# Patient Record
Sex: Female | Born: 1960 | Race: White | Hispanic: No | Smoking: Never smoker
Health system: Southern US, Community
[De-identification: ages and names within clinical notes are randomized; demographics above are authoritative.]

## PROBLEM LIST (undated history)

## (undated) DIAGNOSIS — R350 Frequency of micturition: Secondary | ICD-10-CM

## (undated) DIAGNOSIS — N201 Calculus of ureter: Secondary | ICD-10-CM

## (undated) DIAGNOSIS — Z87442 Personal history of urinary calculi: Secondary | ICD-10-CM

## (undated) DIAGNOSIS — R3915 Urgency of urination: Secondary | ICD-10-CM

---
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
## 2011-09-22 ENCOUNTER — Other Ambulatory Visit: Payer: Self-pay | Admitting: Neurosurgery

## 2011-09-22 DIAGNOSIS — M542 Cervicalgia: Secondary | ICD-10-CM

## 2011-09-25 ENCOUNTER — Ambulatory Visit
Admission: RE | Admit: 2011-09-25 | Discharge: 2011-09-25 | Disposition: A | Discharge status: Home / Self Care | Payer: BC Managed Care – PPO | Source: Ambulatory Visit | Attending: Neurosurgery | Admitting: Neurosurgery

## 2011-09-25 DIAGNOSIS — M542 Cervicalgia: Secondary | ICD-10-CM

## 2014-01-19 ENCOUNTER — Encounter (HOSPITAL_COMMUNITY): Payer: Self-pay | Admitting: Emergency Medicine

## 2014-01-19 ENCOUNTER — Emergency Department (HOSPITAL_COMMUNITY): Payer: BC Managed Care – PPO

## 2014-01-19 ENCOUNTER — Emergency Department (HOSPITAL_COMMUNITY)
Admission: EM | Admit: 2014-01-19 | Discharge: 2014-01-19 | Disposition: A | Discharge status: Home / Self Care | Payer: BC Managed Care – PPO | Attending: Emergency Medicine | Admitting: Emergency Medicine

## 2014-01-19 DIAGNOSIS — N23 Unspecified renal colic: Secondary | ICD-10-CM | POA: Insufficient documentation

## 2014-01-19 DIAGNOSIS — Z79899 Other long term (current) drug therapy: Secondary | ICD-10-CM | POA: Insufficient documentation

## 2014-01-19 LAB — URINALYSIS, ROUTINE W REFLEX MICROSCOPIC
Bilirubin Urine: NEGATIVE
GLUCOSE, UA: NEGATIVE mg/dL
Ketones, ur: NEGATIVE mg/dL
Nitrite: NEGATIVE
Specific Gravity, Urine: >1.03 — ABNORMAL HIGH (ref 1.005–1.030)
Urobilinogen, UA: 0.2 mg/dL (ref 0.0–1.0)
pH: 5.5 (ref 5.0–8.0)

## 2014-01-19 LAB — URINE MICROSCOPIC-ADD ON

## 2014-01-19 LAB — COMPREHENSIVE METABOLIC PANEL
ALK PHOS: 74 U/L (ref 39–117)
ALT: 18 U/L (ref 0–35)
AST: 21 U/L (ref 0–37)
Albumin: 4.1 g/dL (ref 3.5–5.2)
BUN: 23 mg/dL (ref 6–23)
CALCIUM: 9.8 mg/dL (ref 8.4–10.5)
CO2: 26 meq/L (ref 19–32)
Chloride: 102 mEq/L (ref 96–112)
Creatinine, Ser: 0.85 mg/dL (ref 0.50–1.10)
GFR, EST AFRICAN AMERICAN: 88 mL/min — AB (ref >90)
GFR, EST NON AFRICAN AMERICAN: 76 mL/min — AB (ref >90)
GLUCOSE: 107 mg/dL — AB (ref 70–99)
POTASSIUM: 4 meq/L (ref 3.7–5.3)
SODIUM: 140 meq/L (ref 137–147)
Total Bilirubin: 0.4 mg/dL (ref 0.3–1.2)
Total Protein: 7.9 g/dL (ref 6.0–8.3)

## 2014-01-19 LAB — CBC
HEMATOCRIT: 44 % (ref 36.0–46.0)
HEMOGLOBIN: 15.1 g/dL — AB (ref 12.0–15.0)
MCH: 30.3 pg (ref 26.0–34.0)
MCHC: 34.3 g/dL (ref 30.0–36.0)
MCV: 88.4 fL (ref 78.0–100.0)
Platelets: 253 10*3/uL (ref 150–400)
RBC: 4.98 MIL/uL (ref 3.87–5.11)
RDW: 12.4 % (ref 11.5–15.5)
WBC: 6.6 10*3/uL (ref 4.0–10.5)

## 2014-01-19 LAB — LIPASE, BLOOD: Lipase: 79 U/L — ABNORMAL HIGH (ref 11–59)

## 2014-01-19 MED ORDER — HYDROMORPHONE HCL PF 1 MG/ML IJ SOLN
1.0000 mg | Freq: Once | INTRAMUSCULAR | Status: AC
  Administered 2014-01-19: 1 mg via INTRAVENOUS
  Filled 2014-01-19: qty 1

## 2014-01-19 MED ORDER — ONDANSETRON HCL 4 MG/2ML IJ SOLN
4.0000 mg | Freq: Once | INTRAMUSCULAR | Status: AC
  Administered 2014-01-19: 4 mg via INTRAVENOUS
  Filled 2014-01-19: qty 2

## 2014-01-19 MED ORDER — OXYCODONE-ACETAMINOPHEN 5-325 MG PO TABS: 1.0000 | ORAL_TABLET | ORAL | Status: DC | PRN

## 2014-01-19 MED ORDER — TAMSULOSIN HCL 0.4 MG PO CAPS: 0.4000 mg | ORAL_CAPSULE | Freq: Every day | ORAL | Status: DC

## 2014-01-19 NOTE — ED Notes (Signed)
L flank pain started suddenly around 1700. Has vomited x 2 since then.  Denies fever or chills.  Had same thing happen 3 weeks ago which lasted overnight.

## 2014-01-19 NOTE — Discharge Instructions (Signed)

## 2014-01-19 NOTE — ED Provider Notes (Signed)
CSN: [id number]     Arrival date & time 01/19/14  1911 History   First MD Initiated Contact with Patient 01/19/14 1930     Chief Complaint  Patient presents with  . Flank Pain     (Consider location/radiation/quality/duration/timing/severity/associated sxs/prior Treatment) HPI Comments: Patient presents to the ER for evaluation of left flank pain. Patient reports that she had somewhat sudden onset of pain in the left lower back and flank area at 5 PM. The pain was severe initially, causing nausea and vomiting. Pain has eased off, not just a dull aching pain. Patient reports that she had a similar episode approximately 3 weeks ago, but the pain resolved quickly after using a heating pad and she did not seek care at that time. Has not had any urinary symptoms.  Patient is a 53 y.o. female presenting with flank pain.  Flank Pain    History reviewed. No pertinent past medical history. History reviewed. No pertinent past surgical history. History reviewed. No pertinent family history. History  Substance Use Topics  . Smoking status: Never Smoker   . Smokeless tobacco: Not on file  . Alcohol Use: No   OB History   Grav Para Term Preterm Abortions TAB SAB Ect Mult Living                 Review of Systems  Genitourinary: Positive for flank pain.  All other systems reviewed and are negative.     Allergies  Review of patient's allergies indicates no known allergies.  Home Medications   Prior to Admission medications   Medication Sig Start Date End Date Taking? Authorizing Provider  oxyCODONE-acetaminophen (PERCOCET) 5-325 MG per tablet Take 1-2 tablets by mouth every 4 (four) hours as needed. 01/19/14   Gilda Creasehristopher J. Pollina, MD  oxyCODONE-acetaminophen (PERCOCET) 5-325 MG per tablet Take 1-2 tablets by mouth every 4 (four) hours as needed. 01/19/14   Gilda Creasehristopher J. Pollina, MD  tamsulosin (FLOMAX) 0.4 MG CAPS capsule Take 1 capsule (0.4 mg total) by mouth daily. 01/19/14    Gilda Creasehristopher J. Pollina, MD   BP 147/108  Pulse 99  Temp(Src) 98 F (36.7 C) (Oral)  Resp 18  Ht 5\' 2"  (1.575 m)  Wt 223 lb 14.4 oz (101.56 kg)  BMI 40.94 kg/m2  SpO2 96% Physical Exam  Constitutional: She is oriented to person, place, and time. She appears well-developed and well-nourished. No distress.  HENT:  Head: Normocephalic and atraumatic.  Right Ear: Hearing normal.  Left Ear: Hearing normal.  Nose: Nose normal.  Mouth/Throat: Oropharynx is clear and moist and mucous membranes are normal.  Eyes: Conjunctivae and EOM are normal. Pupils are equal, round, and reactive to light.  Neck: Normal range of motion. Neck supple.  Cardiovascular: Regular rhythm, S1 normal and S2 normal.  Exam reveals no gallop and no friction rub.   No murmur heard. Pulmonary/Chest: Effort normal and breath sounds normal. No respiratory distress. She exhibits no tenderness.  Abdominal: Soft. Normal appearance and bowel sounds are normal. There is no hepatosplenomegaly. There is no tenderness. There is no rebound, no guarding, no tenderness at McBurney's point and negative Murphy's sign. No hernia.  Musculoskeletal: Normal range of motion.  Neurological: She is alert and oriented to person, place, and time. She has normal strength. No cranial nerve deficit or sensory deficit. Coordination normal. GCS eye subscore is 4. GCS verbal subscore is 5. GCS motor subscore is 6.  Skin: Skin is warm, dry and intact. No rash noted. No cyanosis.  Psychiatric:  She has a normal mood and affect. Her speech is normal and behavior is normal. Thought content normal.    ED Course  Procedures (including critical care time) Labs Review Labs Reviewed  CBC - Abnormal; Notable for the following:    Hemoglobin 15.1 (*)    All other components within normal limits  COMPREHENSIVE METABOLIC PANEL - Abnormal; Notable for the following:    Glucose, Bld 107 (*)    GFR calc non Af Amer 76 (*)    GFR calc Af Amer 88 (*)    All  other components within normal limits  LIPASE, BLOOD - Abnormal; Notable for the following:    Lipase 79 (*)    All other components within normal limits  URINALYSIS, ROUTINE W REFLEX MICROSCOPIC - Abnormal; Notable for the following:    APPearance HAZY (*)    Specific Gravity, Urine >1.030 (*)    Hgb urine dipstick SMALL (*)    Protein, ur TRACE (*)    Leukocytes, UA TRACE (*)    All other components within normal limits  URINE MICROSCOPIC-ADD ON - Abnormal; Notable for the following:    Squamous Epithelial / LPF MANY (*)    Bacteria, UA FEW (*)    All other components within normal limits    Imaging Review Ct Abdomen Pelvis Wo Contrast  01/19/2014   CLINICAL DATA:  53 year old female with left flank, abdominal and pelvic pain with nausea and vomiting.  EXAM: CT ABDOMEN AND PELVIS WITHOUT CONTRAST  TECHNIQUE: Multidetector CT imaging of the abdomen and pelvis was performed following the standard protocol without IV contrast.  COMPARISON:  None.  FINDINGS: A 6 mm proximal left ureteral calculus causes moderate left hydronephrosis.  No other urinary calculi identified.  The liver, spleen, pancreas, gallbladder, adrenal glands and right kidney are unremarkable.  Please note that parenchymal abnormalities may be missed without intravenous contrast.  There is no evidence of free fluid, enlarged lymph nodes, biliary dilation or abdominal aortic aneurysm.  The bowel, bladder and appendix are unremarkable. There is no evidence of bowel obstruction, abscess or pneumoperitoneum.  No acute or suspicious bony abnormalities are identified.  Bilateral L5 pars defects are noted with grade 2 anterolisthesis of L5 on S1.  IMPRESSION: 6 mm proximal left ureteral calculus causing moderate left hydronephrosis.  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   Electronically Signed   By: Laveda AbbeJeff  Hu M.D.   On: 01/19/2014 20:25     EKG Interpretation None      MDM   Final diagnoses:  Renal  colic on left side    Patient presents to the ER for evaluation of left flank pain. Patient had an episode 3 weeks ago which resolved and now returns today. Symptoms seemed consistent with renal colic, but she has never had a kidney stone. Workup has confirmed a 6 mm proximal ureteral stone on the left which explains the patient's symptoms. The pain has moved down into the left groin since the CAT scan, this could be consistent with the stone moving more distally. She has had very good pain control. I did counsel her that she has a large stone and it may not pass. She will need followup with urology. Patient is comfortable now, appropriate for discharge. She was told that if she has uncontrolled pain she is to come back, or go [facility] where there is the urologist on call that could help her.    Gilda Creasehristopher J. Pollina, MD  01/19/14 2138 

## 2014-01-22 ENCOUNTER — Ambulatory Visit (INDEPENDENT_AMBULATORY_CARE_PROVIDER_SITE_OTHER): Payer: BC Managed Care – PPO | Admitting: Urology

## 2014-01-22 ENCOUNTER — Other Ambulatory Visit: Payer: Self-pay | Admitting: Urology

## 2014-01-22 ENCOUNTER — Encounter (HOSPITAL_COMMUNITY): Payer: Self-pay | Admitting: General Practice

## 2014-01-22 DIAGNOSIS — N201 Calculus of ureter: Secondary | ICD-10-CM

## 2014-01-23 ENCOUNTER — Encounter (HOSPITAL_COMMUNITY): Payer: Self-pay | Admitting: Pharmacy Technician

## 2014-01-24 ENCOUNTER — Encounter (HOSPITAL_COMMUNITY)
Admission: RE | Disposition: A | Discharge status: Home / Self Care | Payer: Self-pay | Source: Ambulatory Visit | Attending: Urology

## 2014-01-24 ENCOUNTER — Encounter (HOSPITAL_COMMUNITY): Payer: Self-pay | Admitting: *Deleted

## 2014-01-24 ENCOUNTER — Ambulatory Visit (HOSPITAL_COMMUNITY): Payer: BC Managed Care – PPO

## 2014-01-24 ENCOUNTER — Ambulatory Visit (HOSPITAL_COMMUNITY)
Admission: RE | Admit: 2014-01-24 | Discharge: 2014-01-24 | Disposition: A | Discharge status: Home / Self Care | Payer: BC Managed Care – PPO | Source: Ambulatory Visit | Attending: Urology | Admitting: Urology

## 2014-01-24 DIAGNOSIS — N201 Calculus of ureter: Secondary | ICD-10-CM | POA: Insufficient documentation

## 2014-01-24 DIAGNOSIS — N189 Chronic kidney disease, unspecified: Secondary | ICD-10-CM | POA: Insufficient documentation

## 2014-01-24 DIAGNOSIS — Z6839 Body mass index (BMI) 39.0-39.9, adult: Secondary | ICD-10-CM | POA: Insufficient documentation

## 2014-01-24 DIAGNOSIS — E669 Obesity, unspecified: Secondary | ICD-10-CM | POA: Insufficient documentation

## 2014-01-24 HISTORY — PX: EXTRACORPOREAL SHOCK WAVE LITHOTRIPSY: SHX1557

## 2014-01-24 SURGERY — LITHOTRIPSY, ESWL
Anesthesia: LOCAL | Laterality: Left

## 2014-01-24 MED ORDER — DIPHENHYDRAMINE HCL 25 MG PO CAPS
25.0000 mg | ORAL_CAPSULE | ORAL | Status: AC
  Administered 2014-01-24: 25 mg via ORAL
  Filled 2014-01-24: qty 1

## 2014-01-24 MED ORDER — DEXTROSE-NACL 5-0.45 % IV SOLN
INTRAVENOUS | Status: DC
  Administered 2014-01-24: 14:00:00 via INTRAVENOUS

## 2014-01-24 MED ORDER — KETOROLAC TROMETHAMINE 30 MG/ML IJ SOLN: 30.0000 mg | Freq: Four times a day (QID) | INTRAMUSCULAR | Status: DC

## 2014-01-24 MED ORDER — ACETAMINOPHEN 325 MG PO TABS: 650.0000 mg | ORAL_TABLET | ORAL | Status: DC | PRN

## 2014-01-24 MED ORDER — OXYCODONE HCL 5 MG PO TABS
5.0000 mg | ORAL_TABLET | ORAL | Status: DC | PRN
  Administered 2014-01-24: 5 mg via ORAL
  Filled 2014-01-24: qty 1

## 2014-01-24 MED ORDER — LEVOFLOXACIN 500 MG PO TABS
500.0000 mg | ORAL_TABLET | ORAL | Status: AC
  Administered 2014-01-24: 500 mg via ORAL
  Filled 2014-01-24: qty 1

## 2014-01-24 MED ORDER — SODIUM CHLORIDE 0.9 % IV SOLN: 250.0000 mL | INTRAVENOUS | Status: DC | PRN

## 2014-01-24 MED ORDER — SODIUM CHLORIDE 0.9 % IJ SOLN: 3.0000 mL | INTRAMUSCULAR | Status: DC | PRN

## 2014-01-24 MED ORDER — SODIUM CHLORIDE 0.9 % IJ SOLN: 3.0000 mL | Freq: Two times a day (BID) | INTRAMUSCULAR | Status: DC

## 2014-01-24 MED ORDER — DIAZEPAM 5 MG PO TABS
10.0000 mg | ORAL_TABLET | ORAL | Status: AC
  Administered 2014-01-24: 10 mg via ORAL
  Filled 2014-01-24: qty 2

## 2014-01-24 MED ORDER — ACETAMINOPHEN 650 MG RE SUPP
650.0000 mg | RECTAL | Status: DC | PRN
  Filled 2014-01-24: qty 1

## 2014-01-24 NOTE — Discharge Instructions (Signed)
See [facility] discharge instructions in chart.  

## 2014-01-24 NOTE — H&P (Signed)
  H&P  Chief Complaint: Kidney stone  History of Present Illness: Judy Bryan is a 53 y.o. year old female who [place] [facility] [place] with a persistently symptomatic 6 mm radiopaque left ureteral stone. The patient desires management. She presents at this time for extracorporeal shockwave lithotripsy, having been instructed in the procedure as well as alternatives, risks and complications.  Past Medical History  Diagnosis Date  . Chronic kidney disease     kidney stones    Past Surgical History  Procedure Laterality Date  . Wisdom tooth extraction  1977    Home Medications:  No prescriptions prior to admission    Allergies: No Known Allergies  History reviewed. No pertinent family history.  Social History:  reports that she has never smoked. She does not have any smokeless tobacco history on file. She reports that she does not drink alcohol or use illicit drugs.  ROS: A complete review of systems was performed.  All systems are negative except for pertinent findings as noted. She is had left flank pain, nausea, left upper abdominal pain.  Physical Exam:  Vital signs in last 24 hours:   General:  Alert and oriented, No acute distress HEENT: Normocephalic, atraumatic Neck: No JVD or lymphadenopathy Cardiovascular: Regular rate and rhythm Lungs: Clear bilaterally Abdomen: Soft, nontender, nondistended, no abdominal masses Back: No CVA tenderness Extremities: No edema Neurologic: Grossly intact  Laboratory Data:  No results found for this or any previous visit (from the past 24 hour(s)). No results found for this or any previous visit (from the past 240 hour(s)). Creatinine:  Recent Labs  01/19/14 1947  CREATININE 0.85    Radiologic Imaging: No results found.  Impression/Assessment:  6 mm left upper ureteral stone  Plan:  Extracorporeal shock wave lithotripsy  Chelsea AusDAHLSTEDT, Laurel Smeltz M 01/24/2014, 10:44 AM  Bertram MillardStephen M.  Elridge Stemm MD

## 2014-02-04 MED FILL — Oxycodone w/ Acetaminophen Tab 5-325 MG: ORAL | Qty: 6 | Status: AC

## 2014-02-19 ENCOUNTER — Encounter: Payer: Self-pay | Admitting: Emergency Medicine

## 2014-02-19 ENCOUNTER — Emergency Department
Admission: EM | Admit: 2014-02-19 | Discharge: 2014-02-19 | Disposition: A | Discharge status: Home / Self Care | Payer: BC Managed Care – PPO | Source: Home / Self Care | Attending: Family Medicine | Admitting: Family Medicine

## 2014-02-19 DIAGNOSIS — N3000 Acute cystitis without hematuria: Secondary | ICD-10-CM

## 2014-02-19 LAB — POCT URINALYSIS DIP (MANUAL ENTRY)
BILIRUBIN UA: NEGATIVE
BILIRUBIN UA: NEGATIVE
GLUCOSE UA: NEGATIVE
Nitrite, UA: NEGATIVE
PROTEIN UA: NEGATIVE
Spec Grav, UA: 1.01 (ref 1.005–1.03)
Urobilinogen, UA: 0.2 (ref 0–1)
pH, UA: 6 (ref 5–8)

## 2014-02-19 MED ORDER — CIPROFLOXACIN HCL 500 MG PO TABS: 500.0000 mg | ORAL_TABLET | Freq: Two times a day (BID) | ORAL | Status: DC

## 2014-02-19 NOTE — Discharge Instructions (Signed)
Continue increased fluid intake. ° °If symptoms become significantly worse during the night or over the weekend, proceed to the local emergency room.  °

## 2014-02-19 NOTE — ED Notes (Signed)
Reports lithotripsy on calculi in left ureter 1 month ago; past 2 days experiencing left groin area intermittent discomfort and dysuria.

## 2014-02-19 NOTE — ED Provider Notes (Signed)
CSN: [id number]     Arrival date & time 02/19/14  1901 History   First MD Initiated Contact with Patient 02/19/14 1934     Chief Complaint  Patient presents with  . Groin Pain  . Dysuria     HPI Comments: Patient complains of two day history of urinary urgency, frequency, and nocturia with minimal dysuria.  She has mild left flank discomfort.  She has had sweats without fever.  No nausea/vomiting.  No pelvic pain or vaginal discharge. She had a 6mm dia left ureteral stone one month ago and subsequently underwent lithotripsy.  She has not yet passed any stone fragments. She has a follow-up visit with her nephrologist next week.  Patient is a 53 y.o. female presenting with frequency. The history is provided by the patient.  Urinary Frequency This is a new problem. The current episode started 2 days ago. The problem occurs constantly. The problem has not changed since onset.Pertinent negatives include no abdominal pain. Nothing aggravates the symptoms. Nothing relieves the symptoms. She has tried nothing for the symptoms.    Past Medical History  Diagnosis Date  . Chronic kidney disease     kidney stones  . H/O renal calculi    Past Surgical History  Procedure Laterality Date  . Wisdom tooth extraction  1977  . Lithotripsy     History reviewed. No pertinent family history. History  Substance Use Topics  . Smoking status: Never Smoker   . Smokeless tobacco: Not on file  . Alcohol Use: No   OB History   Grav Para Term Preterm Abortions TAB SAB Ect Mult Living                 Review of Systems  Constitutional: Positive for chills. Negative for fever.  Gastrointestinal: Negative for abdominal pain.  Genitourinary: Positive for urgency, frequency and flank pain. Negative for dysuria, hematuria, decreased urine volume, vaginal bleeding, vaginal discharge, difficulty urinating and pelvic pain.  All other systems reviewed and are negative.   Allergies  Review of patient's  allergies indicates no known allergies.  Home Medications   Prior to Admission medications   Medication Sig Start Date End Date Taking? Authorizing Provider  ciprofloxacin (CIPRO) 500 MG tablet Take 1 tablet (500 mg total) by mouth 2 (two) times daily. 02/19/14   Lattie HawStephen A Beese, MD  docusate sodium (COLACE) 100 MG capsule Take 100 mg by mouth 2 (two) times daily as needed for mild constipation.    Historical Provider, MD  oxyCODONE-acetaminophen (PERCOCET/ROXICET) 5-325 MG per tablet Take 1-2 tablets by mouth every 4 (four) hours as needed for severe pain.    Historical Provider, MD   BP 121/86  Pulse 86  Temp(Src) 98.2 F (36.8 C) (Oral)  Resp 16  Ht 5\' 2"  (1.575 m)  Wt 213 lb (96.616 kg)  BMI 38.95 kg/m2  SpO2 99% Physical Exam Nursing notes and Vital Signs reviewed. Appearance:  Patient appears stated age, and in no acute distress.  Patient is obese (BMI 39.0) Eyes:  Pupils are equal, round, and reactive to light and accomodation.  Extraocular movement is intact.  Conjunctivae are not inflamed  Pharynx:  Normal; moist mucous membranes  Neck:  Supple.   No adenopathy Lungs:  Clear to auscultation.  Breath sounds are equal.  Heart:  Regular rate and rhythm without murmurs, rubs, or gallops.  Abdomen:   Mild suprapubic tenderness without masses or hepatosplenomegaly.  Bowel sounds are present.  Mild left flank tenderness.  Extremities:  No edema.  No calf tenderness Skin:  No rash present.   ED Course  Procedures  none    Labs Reviewed  URINE CULTURE  POCT URINALYSIS DIP (MANUAL ENTRY):  BLO small;  LEU Moderate      MDM   1. Acute cystitis without hematuria; ?nephrolithiasis    Urine culture pending.  Begin Cipro. Continue increased fluid intake. If symptoms become significantly worse during the night or over the weekend, proceed to the local emergency room. Followup with urologist as scheduled.    Lattie Haw, MD 02/20/14 [id number]

## 2014-02-21 LAB — URINE CULTURE: Colony Count: 9000

## 2014-02-25 ENCOUNTER — Other Ambulatory Visit: Payer: Self-pay | Admitting: Urology

## 2014-02-25 ENCOUNTER — Ambulatory Visit (HOSPITAL_COMMUNITY)
Admission: RE | Admit: 2014-02-25 | Discharge: 2014-02-25 | Disposition: A | Discharge status: Home / Self Care | Payer: BC Managed Care – PPO | Source: Ambulatory Visit | Attending: Urology | Admitting: Urology

## 2014-02-25 DIAGNOSIS — R109 Unspecified abdominal pain: Secondary | ICD-10-CM | POA: Insufficient documentation

## 2014-02-25 DIAGNOSIS — Z9889 Other specified postprocedural states: Secondary | ICD-10-CM | POA: Insufficient documentation

## 2014-02-25 DIAGNOSIS — Z87442 Personal history of urinary calculi: Secondary | ICD-10-CM | POA: Insufficient documentation

## 2014-02-25 DIAGNOSIS — N201 Calculus of ureter: Secondary | ICD-10-CM

## 2014-02-26 ENCOUNTER — Ambulatory Visit: Payer: BC Managed Care – PPO | Admitting: Urology

## 2014-04-16 ENCOUNTER — Other Ambulatory Visit: Payer: Self-pay | Admitting: Urology

## 2014-04-16 ENCOUNTER — Ambulatory Visit (INDEPENDENT_AMBULATORY_CARE_PROVIDER_SITE_OTHER): Payer: BC Managed Care – PPO | Admitting: Urology

## 2014-04-16 ENCOUNTER — Ambulatory Visit (HOSPITAL_COMMUNITY)
Admission: RE | Admit: 2014-04-16 | Discharge: 2014-04-16 | Disposition: A | Discharge status: Home / Self Care | Payer: BC Managed Care – PPO | Source: Ambulatory Visit | Attending: Urology | Admitting: Urology

## 2014-04-16 DIAGNOSIS — N201 Calculus of ureter: Secondary | ICD-10-CM | POA: Insufficient documentation

## 2014-04-18 ENCOUNTER — Other Ambulatory Visit: Payer: Self-pay | Admitting: Urology

## 2014-05-03 ENCOUNTER — Encounter (HOSPITAL_BASED_OUTPATIENT_CLINIC_OR_DEPARTMENT_OTHER): Payer: Self-pay | Admitting: *Deleted

## 2014-05-06 ENCOUNTER — Encounter (HOSPITAL_BASED_OUTPATIENT_CLINIC_OR_DEPARTMENT_OTHER): Payer: Self-pay | Admitting: *Deleted

## 2014-05-06 NOTE — Progress Notes (Signed)
NPO AFTER MN. ARRIVE AT 1000.  NEEDS HG AND KUB. MAY TAKE TRAMADOL AM DOS W/ SIPS OF WATER.

## 2014-05-10 ENCOUNTER — Encounter (HOSPITAL_BASED_OUTPATIENT_CLINIC_OR_DEPARTMENT_OTHER)
Admission: RE | Disposition: A | Discharge status: Home / Self Care | Payer: Self-pay | Source: Ambulatory Visit | Attending: Urology

## 2014-05-10 ENCOUNTER — Encounter (HOSPITAL_BASED_OUTPATIENT_CLINIC_OR_DEPARTMENT_OTHER): Payer: BC Managed Care – PPO | Admitting: Anesthesiology

## 2014-05-10 ENCOUNTER — Ambulatory Visit (HOSPITAL_COMMUNITY): Payer: BC Managed Care – PPO

## 2014-05-10 ENCOUNTER — Ambulatory Visit (HOSPITAL_BASED_OUTPATIENT_CLINIC_OR_DEPARTMENT_OTHER)
Admission: RE | Admit: 2014-05-10 | Discharge: 2014-05-10 | Disposition: A | Discharge status: Home / Self Care | Payer: BC Managed Care – PPO | Source: Ambulatory Visit | Attending: Urology | Admitting: Urology

## 2014-05-10 ENCOUNTER — Encounter (HOSPITAL_BASED_OUTPATIENT_CLINIC_OR_DEPARTMENT_OTHER): Payer: Self-pay | Admitting: *Deleted

## 2014-05-10 ENCOUNTER — Ambulatory Visit (HOSPITAL_BASED_OUTPATIENT_CLINIC_OR_DEPARTMENT_OTHER): Payer: BC Managed Care – PPO | Admitting: Anesthesiology

## 2014-05-10 DIAGNOSIS — N1330 Unspecified hydronephrosis: Secondary | ICD-10-CM | POA: Insufficient documentation

## 2014-05-10 DIAGNOSIS — N201 Calculus of ureter: Secondary | ICD-10-CM

## 2014-05-10 DIAGNOSIS — N202 Calculus of kidney with calculus of ureter: Secondary | ICD-10-CM | POA: Diagnosis present

## 2014-05-10 HISTORY — DX: Frequency of micturition: R35.0

## 2014-05-10 HISTORY — PX: CYSTOSCOPY WITH RETROGRADE PYELOGRAM, URETEROSCOPY AND STENT PLACEMENT: SHX5789

## 2014-05-10 HISTORY — PX: HOLMIUM LASER APPLICATION: SHX5852

## 2014-05-10 HISTORY — DX: Urgency of urination: R39.15

## 2014-05-10 HISTORY — DX: Personal history of urinary calculi: Z87.442

## 2014-05-10 HISTORY — DX: Calculus of ureter: N20.1

## 2014-05-10 HISTORY — PX: STONE EXTRACTION WITH BASKET: SHX5318

## 2014-05-10 LAB — POCT HEMOGLOBIN-HEMACUE: Hemoglobin: 14.5 g/dL (ref 12.0–15.0)

## 2014-05-10 SURGERY — CYSTOURETEROSCOPY, WITH RETROGRADE PYELOGRAM AND STENT INSERTION
Anesthesia: General | Site: Ureter | Laterality: Left

## 2014-05-10 MED ORDER — MEPERIDINE HCL 25 MG/ML IJ SOLN
6.2500 mg | INTRAMUSCULAR | Status: DC | PRN
Start: 2014-05-10 — End: 2014-05-10
  Filled 2014-05-10: qty 1

## 2014-05-10 MED ORDER — BELLADONNA ALKALOIDS-OPIUM 16.2-60 MG RE SUPP
RECTAL | Status: AC
  Filled 2014-05-10: qty 1

## 2014-05-10 MED ORDER — CEFAZOLIN SODIUM-DEXTROSE 2-3 GM-% IV SOLR
2.0000 g | INTRAVENOUS | Status: AC
Start: 2014-05-10 — End: 2014-05-10
  Administered 2014-05-10: 2 g via INTRAVENOUS
  Filled 2014-05-10: qty 50

## 2014-05-10 MED ORDER — ACETAMINOPHEN 10 MG/ML IV SOLN
INTRAVENOUS | Status: DC | PRN
  Administered 2014-05-10: 1000 mg via INTRAVENOUS

## 2014-05-10 MED ORDER — KETOROLAC TROMETHAMINE 30 MG/ML IJ SOLN
INTRAMUSCULAR | Status: DC | PRN
  Administered 2014-05-10: 30 mg via INTRAVENOUS

## 2014-05-10 MED ORDER — HYDROCODONE-ACETAMINOPHEN 5-325 MG PO TABS
ORAL_TABLET | ORAL | Status: AC
  Filled 2014-05-10: qty 1

## 2014-05-10 MED ORDER — PROPOFOL 10 MG/ML IV BOLUS
INTRAVENOUS | Status: DC | PRN
  Administered 2014-05-10: 160 mg via INTRAVENOUS

## 2014-05-10 MED ORDER — FENTANYL CITRATE 0.05 MG/ML IJ SOLN
INTRAMUSCULAR | Status: DC | PRN
  Administered 2014-05-10: 50 ug via INTRAVENOUS
  Administered 2014-05-10 (×2): 25 ug via INTRAVENOUS

## 2014-05-10 MED ORDER — CEPHALEXIN 250 MG PO CAPS: 500.0000 mg | ORAL_CAPSULE | Freq: Two times a day (BID) | ORAL | Status: DC

## 2014-05-10 MED ORDER — MIDAZOLAM HCL 2 MG/2ML IJ SOLN
INTRAMUSCULAR | Status: AC
  Filled 2014-05-10: qty 2

## 2014-05-10 MED ORDER — MIDAZOLAM HCL 5 MG/5ML IJ SOLN
INTRAMUSCULAR | Status: DC | PRN
  Administered 2014-05-10: 2 mg via INTRAVENOUS

## 2014-05-10 MED ORDER — DEXAMETHASONE SODIUM PHOSPHATE 4 MG/ML IJ SOLN
INTRAMUSCULAR | Status: DC | PRN
  Administered 2014-05-10: 8 mg via INTRAVENOUS

## 2014-05-10 MED ORDER — FENTANYL CITRATE 0.05 MG/ML IJ SOLN
25.0000 ug | INTRAMUSCULAR | Status: DC | PRN
  Filled 2014-05-10: qty 1

## 2014-05-10 MED ORDER — FENTANYL CITRATE 0.05 MG/ML IJ SOLN
INTRAMUSCULAR | Status: AC
  Filled 2014-05-10: qty 6

## 2014-05-10 MED ORDER — LACTATED RINGERS IV SOLN
INTRAVENOUS | Status: DC
  Administered 2014-05-10: 11:00:00 via INTRAVENOUS
  Filled 2014-05-10: qty 1000

## 2014-05-10 MED ORDER — PROMETHAZINE HCL 25 MG/ML IJ SOLN
6.2500 mg | INTRAMUSCULAR | Status: DC | PRN
  Filled 2014-05-10: qty 1

## 2014-05-10 MED ORDER — HYDROCODONE-ACETAMINOPHEN 5-325 MG PO TABS: 1.0000 | ORAL_TABLET | ORAL | Status: DC | PRN

## 2014-05-10 MED ORDER — IOHEXOL 350 MG/ML SOLN
INTRAVENOUS | Status: DC | PRN
  Administered 2014-05-10: 7 mL via URETHRAL

## 2014-05-10 MED ORDER — CEFAZOLIN SODIUM-DEXTROSE 2-3 GM-% IV SOLR
INTRAVENOUS | Status: AC
  Filled 2014-05-10: qty 50

## 2014-05-10 MED ORDER — LIDOCAINE HCL (CARDIAC) 20 MG/ML IV SOLN
INTRAVENOUS | Status: DC | PRN
  Administered 2014-05-10: 60 mg via INTRAVENOUS

## 2014-05-10 MED ORDER — ONDANSETRON HCL 4 MG/2ML IJ SOLN
INTRAMUSCULAR | Status: DC | PRN
  Administered 2014-05-10: 4 mg via INTRAVENOUS

## 2014-05-10 MED ORDER — CEFAZOLIN SODIUM 1-5 GM-% IV SOLN
1.0000 g | INTRAVENOUS | Status: DC
  Filled 2014-05-10: qty 50

## 2014-05-10 MED ORDER — HYDROCODONE-ACETAMINOPHEN 5-325 MG PO TABS
1.0000 | ORAL_TABLET | ORAL | Status: DC | PRN
  Administered 2014-05-10: 1 via ORAL
  Filled 2014-05-10: qty 2

## 2014-05-10 MED ORDER — SODIUM CHLORIDE 0.9 % IR SOLN
Status: DC | PRN
  Administered 2014-05-10: 6000 mL

## 2014-05-10 MED ORDER — LACTATED RINGERS IV SOLN
INTRAVENOUS | Status: DC
  Filled 2014-05-10: qty 1000

## 2014-05-10 SURGICAL SUPPLY — 24 items
ADAPTER CATH URET PLST 4-6FR (CATHETERS) IMPLANT
BAG DRAIN URO-CYSTO SKYTR STRL (DRAIN) ×3 IMPLANT
BASKET ZERO TIP NITINOL 2.4FR (BASKET) ×3 IMPLANT
CANISTER SUCT LVC 12 LTR MEDI- (MISCELLANEOUS) ×3 IMPLANT
CATH INTERMIT  6FR 70CM (CATHETERS) ×3 IMPLANT
CLOTH BEACON ORANGE TIMEOUT ST (SAFETY) ×3 IMPLANT
DRAPE CAMERA CLOSED 9X96 (DRAPES) ×3 IMPLANT
FIBER LASER FLEXIVA 365 (UROLOGICAL SUPPLIES) ×3 IMPLANT
GLOVE BIO SURGEON STRL SZ8 (GLOVE) ×3 IMPLANT
GLOVE BIOGEL M 6.5 STRL (GLOVE) ×3 IMPLANT
GLOVE BIOGEL M 7.0 STRL (GLOVE) ×3 IMPLANT
GLOVE SURG SS PI 7.5 STRL IVOR (GLOVE) ×6 IMPLANT
GOWN PREVENTION PLUS LG XLONG (DISPOSABLE) IMPLANT
GOWN STRL REIN XL XLG (GOWN DISPOSABLE) IMPLANT
GOWN STRL REUS W/TWL XL LVL3 (GOWN DISPOSABLE) ×6 IMPLANT
GUIDEWIRE 0.038 PTFE COATED (WIRE) IMPLANT
GUIDEWIRE ANG ZIPWIRE 038X150 (WIRE) IMPLANT
GUIDEWIRE STR DUAL SENSOR (WIRE) IMPLANT
IV NS IRRIG 3000ML ARTHROMATIC (IV SOLUTION) ×6 IMPLANT
NS IRRIG 500ML POUR BTL (IV SOLUTION) IMPLANT
PACK CYSTO (CUSTOM PROCEDURE TRAY) ×3 IMPLANT
SHEATH URET ACCESS 12FR/35CM (UROLOGICAL SUPPLIES) ×3 IMPLANT
STENT URET 6FRX24 CONTOUR (STENTS) ×3 IMPLANT
WATER STERILE IRR 1000ML POUR (IV SOLUTION) ×3 IMPLANT

## 2014-05-10 NOTE — Discharge Instructions (Signed)
POSTOPERATIVE CARE AFTER URETEROSCOPY  Stent management  *Stents are often left in after ureteroscopy and stone treatment. If left in, they often cause urinary frequency, urgency, occasional blood in the urine, as well as flank discomfort with urination. These are all expected issues, and should resolve after the stent is removed. Remove stent by pulling on string on Monday morning.  *Often times, a small thread is left on the end of the stent, and brought out through the urethra. If so, this is used to remove the stent, making it unnecessary to look in the bladder with a scope in the office to remove the stent. If a thread is left on, did not pull on it until instructed.  Diet  Once you have adequately recovered from anesthesia, you may gradually advance your diet, as tolerated, to your regular diet.  Activities  You may gradually increase your activities to your normal unrestricted level the day following your procedure.  Medications  You should resume all preoperative medications. If you are on aspirin-like compounds, you should not resume these until the blood clears from your urine. If given an antibiotic by the surgeon, take these until they are completed. You may also be given, if you have a stent, medications to decrease the urinary frequency and urgency.  Pain  After ureteroscopy, there may be some pain on the side of the scope. Take your pain medicine for this. Usually, this pain resolves within a day or 2.  Fever  Please report any fever over 100 to the doctor.    Post Anesthesia Home Care Instructions  Activity: Get plenty of rest for the remainder of the day. A responsible adult should stay with you for 24 hours following the procedure.  For the next 24 hours, DO NOT: -Drive a car -Advertising copywriterperate machinery -Drink alcoholic beverages -Take any medication unless instructed by your physician -Make any legal decisions or sign important papers.  Meals: Start with liquid  foods such as gelatin or soup. Progress to regular foods as tolerated. Avoid greasy, spicy, heavy foods. If nausea and/or vomiting occur, drink only clear liquids until the nausea and/or vomiting subsides. Call your physician if vomiting continues.  Special Instructions/Symptoms: Your throat may feel dry or sore from the anesthesia or the breathing tube placed in your throat during surgery. If this causes discomfort, gargle with warm salt water. The discomfort should disappear within 24 hours.  Post Anesthesia Home Care Instructions  Activity: Get plenty of rest for the remainder of the day. A responsible adult should stay with you for 24 hours following the procedure.  For the next 24 hours, DO NOT: -Drive a car -Advertising copywriterperate machinery -Drink alcoholic beverages -Take any medication unless instructed by your physician -Make any legal decisions or sign important papers.  Meals: Start with liquid foods such as gelatin or soup. Progress to regular foods as tolerated. Avoid greasy, spicy, heavy foods. If nausea and/or vomiting occur, drink only clear liquids until the nausea and/or vomiting subsides. Call your physician if vomiting continues.  Special Instructions/Symptoms: Your throat may feel dry or sore from the anesthesia or the breathing tube placed in your throat during surgery. If this causes discomfort, gargle with warm salt water. The discomfort should disappear within 24 hours.

## 2014-05-10 NOTE — Transfer of Care (Signed)
Immediate Anesthesia Transfer of Care Note  Patient: Judy Bryan  Procedure(s) Performed: Procedure(s) (LRB): CYSTOSCOPY WITH RETROGRADE PYELOGRAM, URETEROSCOPY AND DOUBLE J STENT PLACEMENT (Left) HOLMIUM LASER APPLICATION (Left) STONE EXTRACTION WITH BASKET (Left)  Patient Location: PACU  Anesthesia Type: General  Level of Consciousness: awake, oriented, sedated and patient cooperative  Airway & Oxygen Therapy: Patient Spontanous Breathing and Patient connected to face mask oxygen  Post-op Assessment: Report given to PACU RN and Post -op Vital signs reviewed and stable  Post vital signs: Reviewed and stable  Complications: No apparent anesthesia complications

## 2014-05-10 NOTE — H&P (Signed)
  H&P  Chief Complaint: Kidney stone  History of Present Illness: Judy Leaveramela P Bryan is a 53 y.o. year old female who presents for ureteroscopic management of a left distal ureteral stone. She underwent ESL of this stone in June of this year but has had incomplete fragmentation and passage.   Past Medical History  Diagnosis Date  . History of kidney stones   . Left ureteral calculus   . Frequency of urination   . Urgency of urination     Past Surgical History  Procedure Laterality Date  . Wisdom tooth extraction  1977  . Extracorporeal shock wave lithotripsy Left 01-24-2014    Home Medications:  No prescriptions prior to admission    Allergies: No Known Allergies  History reviewed. No pertinent family history.  Social History:  reports that she has never smoked. She has never used smokeless tobacco. She reports that she does not drink alcohol or use illicit drugs.  ROS: A complete review of systems was performed.  All systems are negative except for pertinent findings as noted--frequency/urgency  Physical Exam:  Vital signs in last 24 hours:   General:  Alert and oriented, No acute distress HEENT: Normocephalic, atraumatic Neck: No JVD or lymphadenopathy Cardiovascular: Regular rate and rhythm Lungs: Clear bilaterally Abdomen: Soft, nontender, nondistended, no abdominal masses Back: No CVA tenderness Extremities: No edema Neurologic: Grossly intact  Laboratory Data:  No results found for this or any previous visit (from the past 24 hour(s)). No results found for this or any previous visit (from the past 240 hour(s)). Creatinine: No results found for this basename: CREATININE,  in the last 168 hours  Radiologic Imaging: No results found.  Impression/Assessment:  Persistent left distal ureteral st Plan:  Left ureteroscopic stone management  Chelsea AusDAHLSTEDT, Jada Kuhnert M 05/10/2014, 6:41 AM  Bertram MillardStephen M. Cristella Stiver MD

## 2014-05-10 NOTE — Anesthesia Procedure Notes (Signed)
Procedure Name: LMA Insertion Date/Time: 05/10/2014 11:54 AM Performed by: Renella CunasHAZEL, Aliscia Clayton D Pre-anesthesia Checklist: Patient identified, Emergency Drugs available, Suction available and Patient being monitored Patient Re-evaluated:Patient Re-evaluated prior to inductionOxygen Delivery Method: Circle System Utilized Preoxygenation: Pre-oxygenation with 100% oxygen Intubation Type: IV induction Ventilation: Mask ventilation without difficulty LMA: LMA inserted LMA Size: 4.0 Number of attempts: 1 Airway Equipment and Method: bite block Placement Confirmation: positive ETCO2 Tube secured with: Tape Dental Injury: Teeth and Oropharynx as per pre-operative assessment

## 2014-05-10 NOTE — Anesthesia Preprocedure Evaluation (Addendum)

## 2014-05-10 NOTE — Op Note (Signed)
Preoperative diagnosis:  1. Left distal ureteral stone  Postoperative diagnosis: 1. Same  Procedure(s): 1. Cystourethroscopy with left retrograde pyelogram with interpretation 2. Left ureteral dilation 3. Left ureteroscopic stone extraction with laser lithotripsy and basket extraction 4. Left ureteral stent placement (6Fr x 24cm)  Surgeon: Dr. Patsi SearsSteven Dahlstedt  Anesthesia: General  Complications: None  EBL: None  Specimens: None  Intraoperative findings: Distal left ureteral stone on fluoroscopy. Retrograde pyelogram revealed filling defect at distal ureteral stone and minimal hydronephrosis. Left ureteral orifice was very tight and required dilation with a ureteral access sheath. Stone required laser lithotripsy to be removed.  Indication: Left symptomatic distal ureteral stone. Failed medical expulsive therapy. Patient requests definitive treatment after thorough discussion of risks, benefits, and alternatives. Risks discussed included but not limited to infection, bleeding, failure to cannulate ureter, failure to remove all of the stone, ureteral perforation, ureteral avulsion, ureteral stricture and rare instances of kidney loss.   Description of procedure:  The patient, consent and site were verified prior to bringing them to the OR where they were placed supine on the OR table. SCDs were placed and IV antibiotics were initiated. General anesthesia was induced and the patient was placed in the dorsal lithotomy position where they were prepped and draped in the usual sterile fashion. Pre-procedure time out was called and the case was begun.  The 21Fr rigid cystoscope with 12 degree lens was inserted into the bladder per urethra with ample lubrication and normal saline running for irrigation. Complete cystoscopy was performed and was normal.  Attention was turned to the left ureteral orifice. Retrograde pyelogram was performed with a 5Fr open ended revealing the above noted  findings. A Sensor wire was placed through the scope up to the renal pelvis. A ureteral access sheath was used to dilate the narrow-appearing orifice up to the stone. There was minimal resistance to this. The short semi-rigid ureteroscope was advanced to the stone. It was unable to be removed intact so the laser was used to fragment the stone and the 0-tip nitinol basket was used to remove the stones. Diagnostic ureteroscopy revealed no additional stones in the ureter and no evidence of injury. The scope was backloaded over the wire and a 6Fr x 24cm ureteral stent with a string was advanced proximally. Good curl was noted in the renal pelvis and in the bladder under direct vision and fluoroscopy.   The patient was then awoken from general anesthesia having tolerated the procedure well.

## 2014-05-10 NOTE — OR Nursing (Signed)
Left ureteral stone taking by Dr. Retta Dionesahlstedt.

## 2014-05-11 NOTE — Anesthesia Postprocedure Evaluation (Signed)
  Anesthesia Post-op Note  Patient: Judy Bryan  Procedure(s) Performed: Procedure(s) (LRB): CYSTOSCOPY WITH RETROGRADE PYELOGRAM, URETEROSCOPY AND DOUBLE J STENT PLACEMENT (Left) HOLMIUM LASER APPLICATION (Left) STONE EXTRACTION WITH BASKET (Left)  Patient Location: PACU  Anesthesia Type: General  Level of Consciousness: awake and alert   Airway and Oxygen Therapy: Patient Spontanous Breathing  Post-op Pain: mild  Post-op Assessment: Post-op Vital signs reviewed, Patient's Cardiovascular Status Stable, Respiratory Function Stable, Patent Airway and No signs of Nausea or vomiting  Last Vitals:  Filed Vitals:   05/10/14 1430  BP: 115/78  Pulse: 72  Temp: 36.1 C  Resp: 18    Post-op Vital Signs: stable   Complications: No apparent anesthesia complications

## 2014-05-13 ENCOUNTER — Encounter (HOSPITAL_BASED_OUTPATIENT_CLINIC_OR_DEPARTMENT_OTHER): Payer: Self-pay | Admitting: Urology

## 2014-06-11 ENCOUNTER — Ambulatory Visit (INDEPENDENT_AMBULATORY_CARE_PROVIDER_SITE_OTHER): Payer: BC Managed Care – PPO | Admitting: Urology

## 2014-06-11 DIAGNOSIS — N390 Urinary tract infection, site not specified: Secondary | ICD-10-CM

## 2014-06-11 DIAGNOSIS — N201 Calculus of ureter: Secondary | ICD-10-CM

## 2015-07-03 IMAGING — CT CT ABD-PELV W/O CM
2 of 3 series · 17 of 46 positions shown, 19 images · non-contrast
Comparison: None.

CLINICAL DATA: 52-year-old female with left flank, abdominal and
pelvic pain with nausea and vomiting.

EXAM:
CT ABDOMEN AND PELVIS WITHOUT CONTRAST
TECHNIQUE: Multidetector CT imaging of the abdomen and pelvis was performed
following the standard protocol without IV contrast.

[Series 2: standard/full over (age)lbs 5.0 · axial · 0.74mm/px · z∈[+362,+752]mm · 14 of 90 slices shown, 16 images]
[im 6/90  soft-tissue]
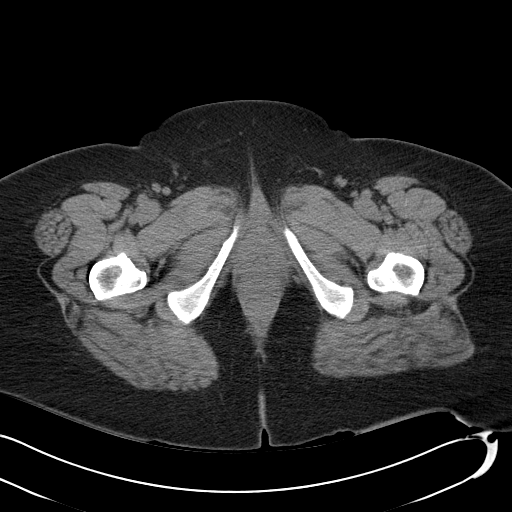
[im 6/90  bone]
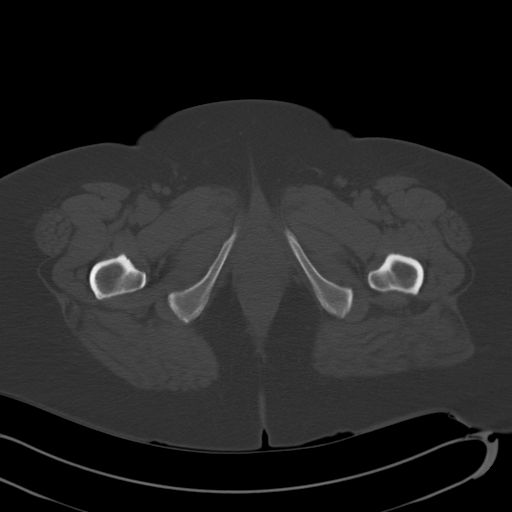
[im 12/90  soft-tissue]
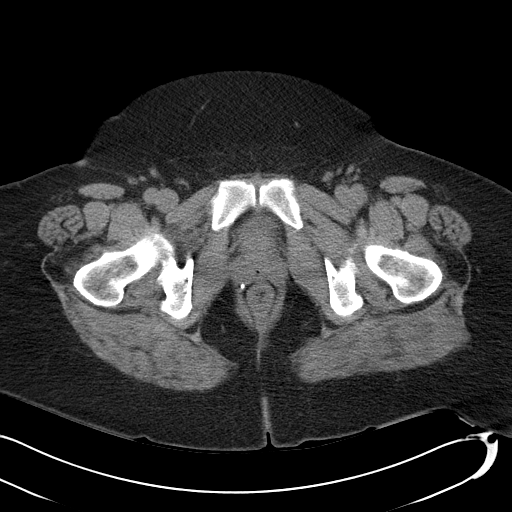
[im 18/90  soft-tissue]
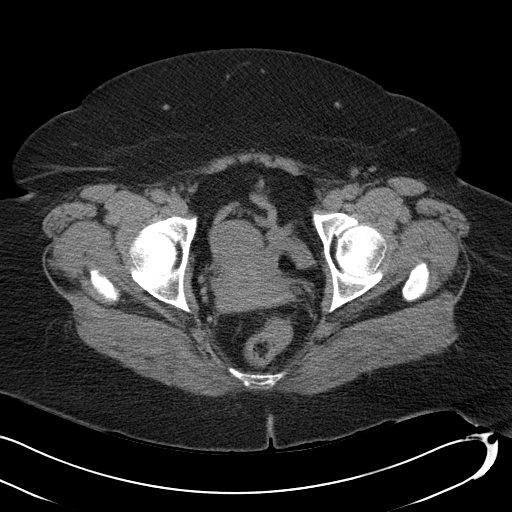
[im 23/90  soft-tissue]
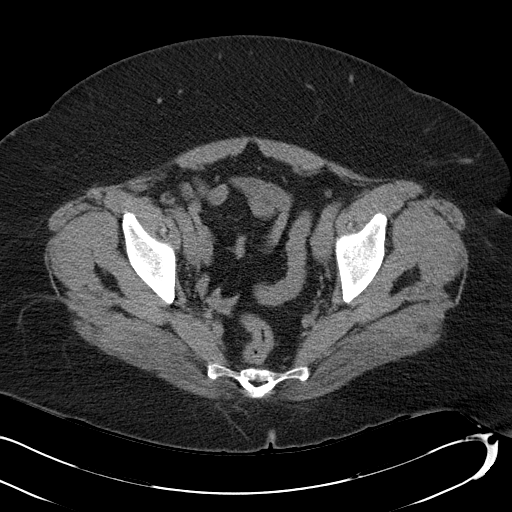
[im 29/90  soft-tissue]
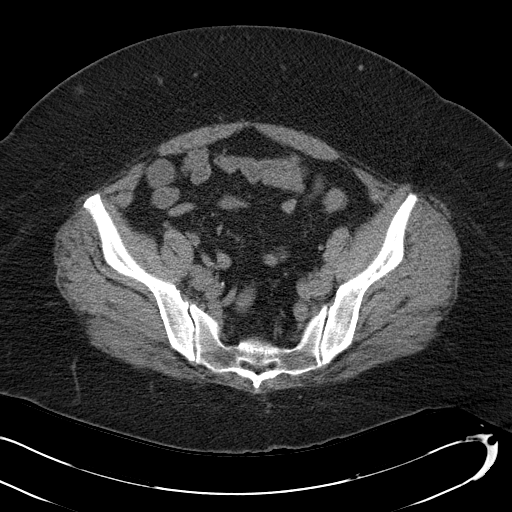
[im 35/90  soft-tissue]
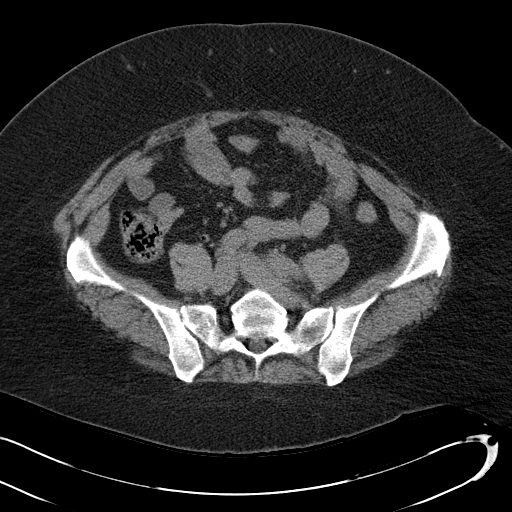
[im 41/90  soft-tissue]
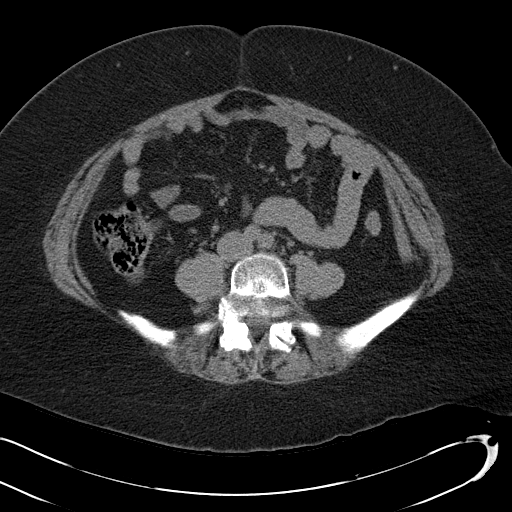
[im 49/90  soft-tissue]
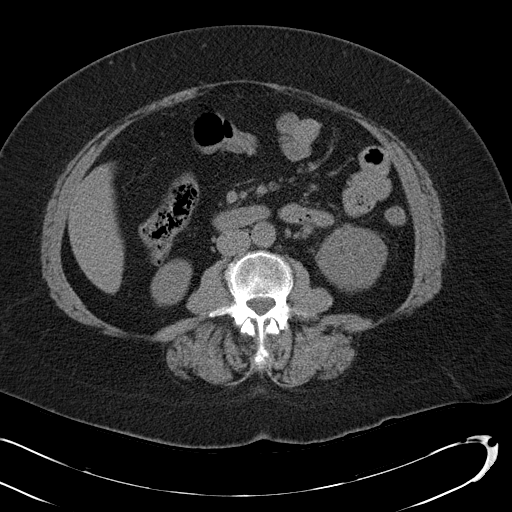
[im 55/90  soft-tissue]
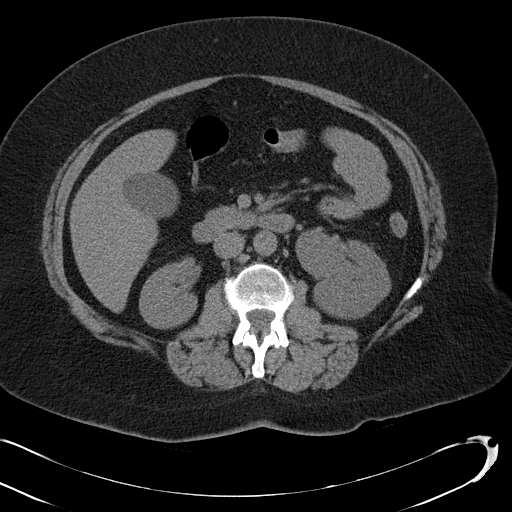
[im 55/90  bone]
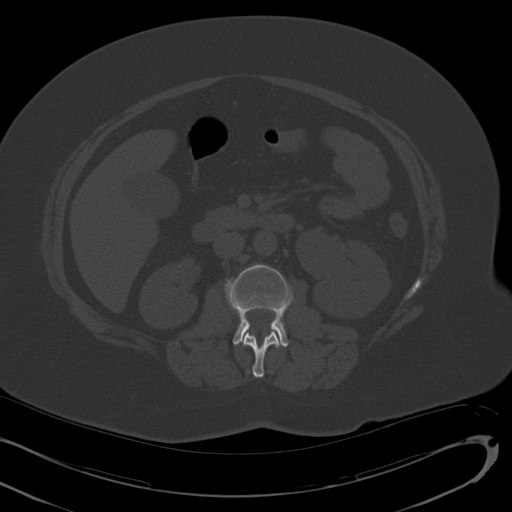
[im 61/90  soft-tissue]
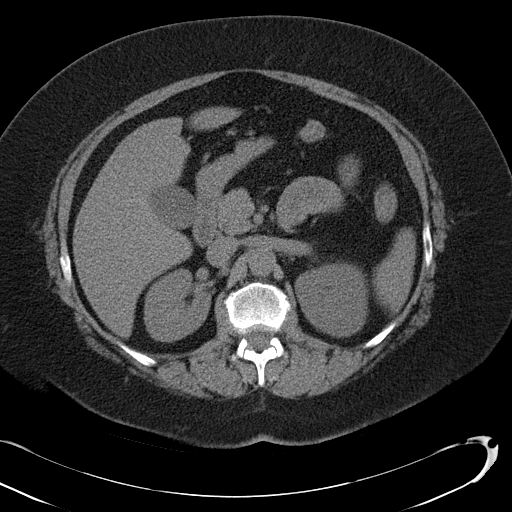
[im 67/90  soft-tissue]
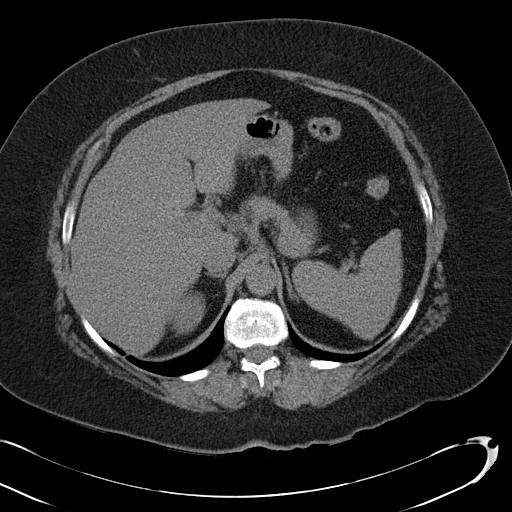
[im 72/90  soft-tissue]
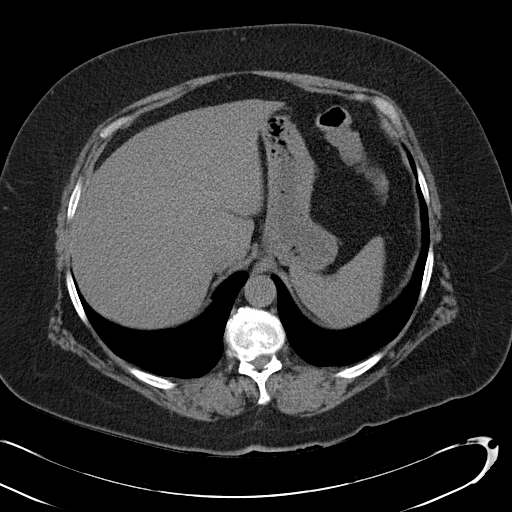
[im 78/90  soft-tissue]
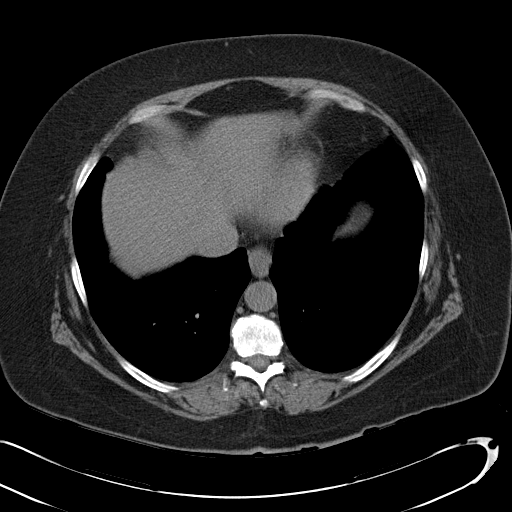
[im 84/90  soft-tissue]
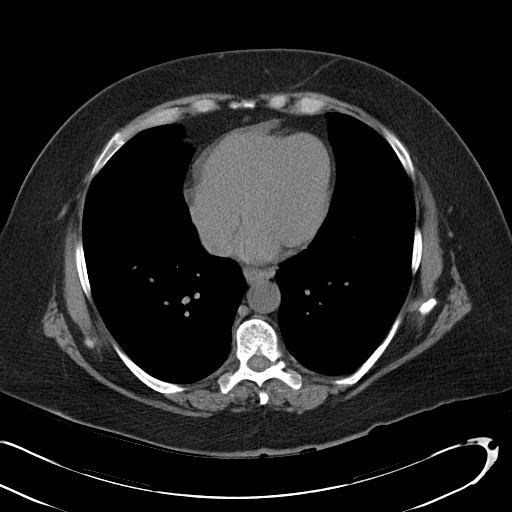

[Series 4: mpr coronal · coronal · 0.88mm/px · 3 of 122 slices shown]
[im 41/122  soft-tissue]
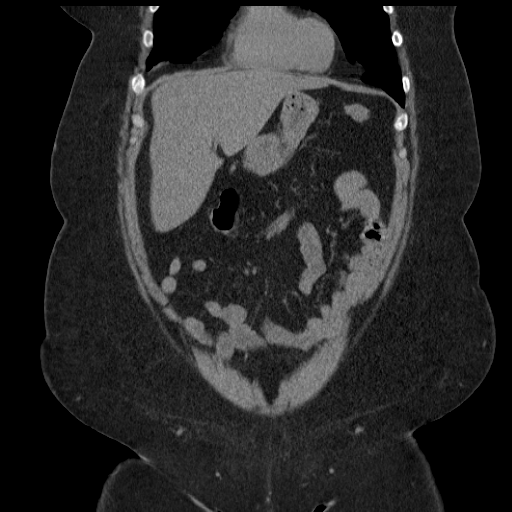
[im 54/122  soft-tissue]
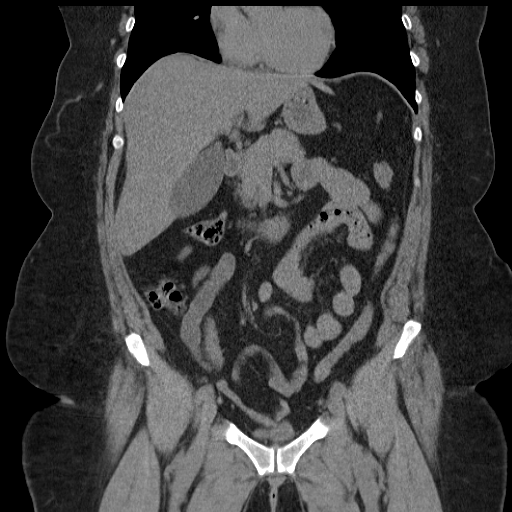
[im 68/122  soft-tissue]
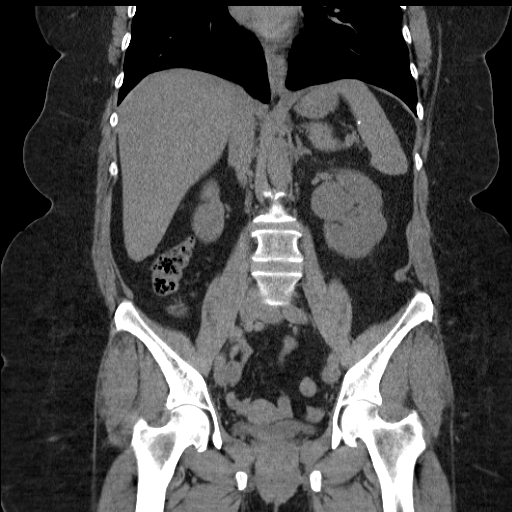

[17 of 46 positions shown; findings below may reference images not displayed]

FINDINGS: A 6 mm proximal left ureteral calculus causes moderate left
hydronephrosis.

No other urinary calculi identified.

The liver, spleen, pancreas, gallbladder, adrenal glands and right
kidney are unremarkable.

Please note that parenchymal abnormalities may be missed without
intravenous contrast.

There is no evidence of free fluid, enlarged lymph nodes, biliary
dilation or abdominal aortic aneurysm.

The bowel, bladder and appendix are unremarkable. There is no
evidence of bowel obstruction, abscess or pneumoperitoneum.

No acute or suspicious bony abnormalities are identified.

Bilateral L5 pars defects are noted with grade 2 anterolisthesis of
L5 on S1.
IMPRESSION: 6 mm proximal left ureteral calculus causing moderate left
hydronephrosis.

Bilateral L5 pars defects with grade 2 anterolisthesis of L5 on S1.

## 2015-07-08 IMAGING — CR DG ABDOMEN 1V
1 series · 1 of 1 positions shown · non-contrast
Comparison: CT scan of January 19, 2014.

CLINICAL DATA: Pre lithotripsy.

EXAM:
ABDOMEN - 1 VIEW

[t abdomen supine]
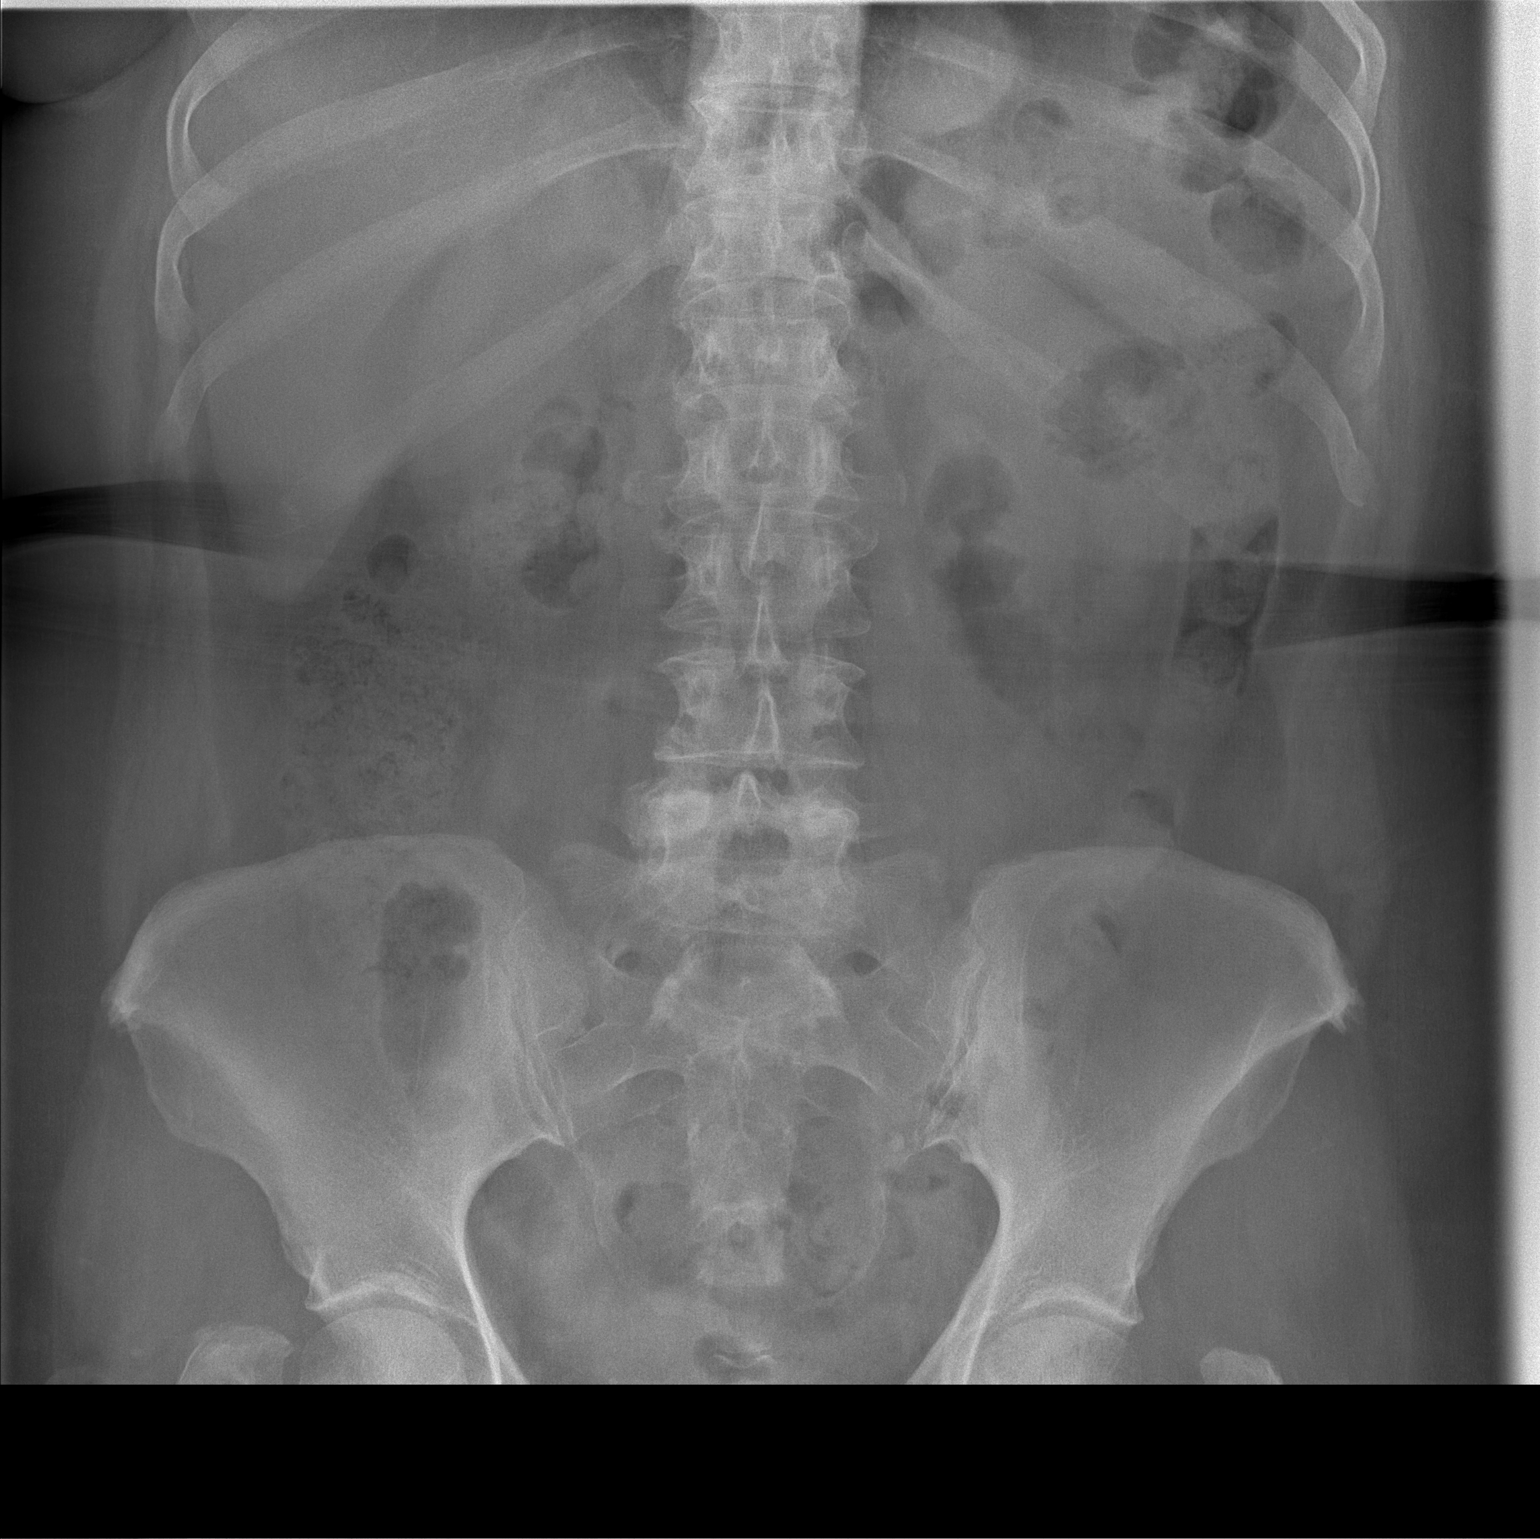

[1 of 1 positions shown; findings below may reference images not displayed]

FINDINGS: The bowel gas pattern is normal. Small rounded calcification is seen
projected over the left sacrum which may represent distal left
ureteral calculus. Phleboliths are noted in the pelvis.
IMPRESSION: Possible distal left ureteral calculus. No evidence of bowel
obstruction or ileus.

## 2020-12-11 ENCOUNTER — Encounter: Payer: Self-pay | Admitting: Gastroenterology

## 2021-05-04 ENCOUNTER — Other Ambulatory Visit: Payer: Self-pay

## 2021-05-04 ENCOUNTER — Ambulatory Visit: Payer: BLUE CROSS/BLUE SHIELD | Admitting: Gastroenterology

## 2021-05-04 ENCOUNTER — Telehealth: Payer: Self-pay | Admitting: *Deleted

## 2021-05-04 ENCOUNTER — Telehealth (INDEPENDENT_AMBULATORY_CARE_PROVIDER_SITE_OTHER): Payer: BLUE CROSS/BLUE SHIELD | Admitting: Gastroenterology

## 2021-05-04 ENCOUNTER — Encounter: Payer: Self-pay | Admitting: Gastroenterology

## 2021-05-04 DIAGNOSIS — Z1211 Encounter for screening for malignant neoplasm of colon: Secondary | ICD-10-CM

## 2021-05-04 NOTE — Telephone Encounter (Signed)
Called pt to schedule TCS with propofol, asa 3 [place]. She wants a Friday. Unable to do 10/7. Advised will call once we receive November schedule.

## 2021-05-04 NOTE — Patient Instructions (Signed)
Colonoscopy as scheduled. See separate instructions.  

## 2021-05-04 NOTE — Telephone Encounter (Signed)
Pt consented to a virtual visit. 

## 2021-05-04 NOTE — Telephone Encounter (Signed)
Valla Leaver, you are scheduled for a virtual visit with your provider today.  Just as we do with appointments in the office, we must obtain your consent to participate.  Your consent will be active for this visit and any virtual visit you may have with one of our providers in the next 365 days.  If you have a MyChart account, I can also send a copy of this consent to you electronically.  All virtual visits are billed to your insurance company just like a traditional visit in the office.  As this is a virtual visit, video technology does not allow for your provider to perform a traditional examination.  This may limit your provider's ability to fully assess your condition.  If your provider identifies any concerns that need to be evaluated in person or the need to arrange testing such as labs, EKG, etc, we will make arrangements to do so.  Although advances in technology are sophisticated, we cannot ensure that it will always work on either your end or our end.  If the connection with a video visit is poor, we may have to switch to a telephone visit.  With either a video or telephone visit, we are not always able to ensure that we have a secure connection.   I need to obtain your verbal consent now.   Are you willing to proceed with your visit today?

## 2021-05-04 NOTE — Progress Notes (Signed)
Primary Care Physician:  April Manson, NP Primary GI:  Hennie Duos. Marletta Lor, DO   Patient Location: Home  Provider Location: [facility] office  Reason for Visit:  Chief Complaint  Patient presents with   Colonoscopy    Consult. Last tcs age 60-ok     Persons present on the virtual encounter, with roles: Patient, myself Linford Arnold, LPN (updated meds and allergies)  Total time (minutes) spent on medical discussion: 15 minutes  Due to COVID-19, visit was conducted using Mychart video method.  Visit was requested by patient.  Virtual Visit via Mychart video  I connected with Judy Bryan on 05/04/21 at 10:30 AM EDT by Mychart video and verified that I am speaking with the correct person using two identifiers.   I discussed the limitations, risks, security and privacy concerns of performing an evaluation and management service by telephone/video and the availability of in person appointments. I also discussed with the patient that there may be a patient responsible charge related to this service. The patient expressed understanding and agreed to proceed.   HPI:   Judy Bryan is a 60 y.o. female who presents for virtual visit regarding request of Judy Brunswick, NP to schedule screening colonoscopy. Last colonoscopy about 10 years ago and unremarkable.   Labs in April 2022: White blood cell count 4500, hemoglobin 13, platelets 272,000, glucose 87, BUN 20, creatinine 0.67, total bilirubin 0.4, alkaline phosphatase 61, AST 21, ALT 24.  Denies constipation, diarrhea, melena, brbpr. Has heartburn but controlled with TUMS. Tries to avoid trigger foods. No dysphagia. No vomiting, abdominal pain.   Paternal grandmother had colon cancer in her 62s.   Current Outpatient Medications  Medication Sig Dispense Refill   Cholecalciferol (VITAMIN D-3) 125 MCG (5000 UT) TABS Take 2 tablets by mouth daily. With magnesium     meloxicam (MOBIC) 15 MG tablet Take 1 tablet by mouth  as needed.     naproxen sodium (ALEVE) 220 MG tablet Take 440 mg by mouth as needed.     Probiotic Product (PROBIOTIC DAILY PO) Take by mouth daily.     No current facility-administered medications for this visit.    Past Medical History:  Diagnosis Date   Frequency of urination    History of kidney stones    Left ureteral calculus    Urgency of urination     Past Surgical History:  Procedure Laterality Date   CYSTOSCOPY WITH RETROGRADE PYELOGRAM, URETEROSCOPY AND STENT PLACEMENT Left 05/10/2014   Procedure: CYSTOSCOPY WITH RETROGRADE PYELOGRAM, URETEROSCOPY AND DOUBLE J STENT PLACEMENT;  Surgeon: Chelsea Aus, MD;  Location: [facility];  Service: Urology;  Laterality: Left;   EXTRACORPOREAL SHOCK WAVE LITHOTRIPSY Left 01-24-2014   HOLMIUM LASER APPLICATION Left 05/10/2014   Procedure: HOLMIUM LASER APPLICATION;  Surgeon: Chelsea Aus, MD;  Location: [facility];  Service: Urology;  Laterality: Left;   STONE EXTRACTION WITH BASKET Left 05/10/2014   Procedure: STONE EXTRACTION WITH BASKET;  Surgeon: Chelsea Aus, MD;  Location[facility];  Service: Urology;  Laterality: Left;   WISDOM TOOTH EXTRACTION  1977    Family History  Problem Relation Age of Onset   Colon cancer Paternal Grandmother        in her 18s    Social History   Socioeconomic History   Marital status: Divorced    Spouse name: Not on file   Number of children: Not on file   Years of  education: Not on file   Highest education level: Not on file  Occupational History   Not on file  Tobacco Use   Smoking status: Never   Smokeless tobacco: Never  Substance and Sexual Activity   Alcohol use: No   Drug use: No   Sexual activity: Not on file  Other Topics Concern   Not on file  Social History Narrative   Not on file   Social Determinants of Health   Financial Resource Strain: Not on file  Food Insecurity: Not on file  Transportation  Needs: Not on file  Physical Activity: Not on file  Stress: Not on file  Social Connections: Not on file  Intimate Partner Violence: Not on file      ROS:  General: Negative for anorexia, weight loss, fever, chills, fatigue, weakness. Eyes: Negative for vision changes.  ENT: Negative for hoarseness, difficulty swallowing , nasal congestion. CV: Negative for chest pain, angina, palpitations, dyspnea on exertion, peripheral edema.  Respiratory: Negative for dyspnea at rest, dyspnea on exertion, cough, sputum, wheezing.  GI: See history of present illness. GU:  Negative for dysuria, hematuria, urinary incontinence, urinary frequency, nocturnal urination.  MS: Negative for joint pain, low back pain.  Derm: Negative for rash or itching.  Neuro: Negative for weakness, abnormal sensation, seizure, frequent headaches, memory loss, confusion.  Psych: Negative for anxiety, depression, suicidal ideation, hallucinations.  Endo: Negative for unusual weight change.  Heme: Negative for bruising or bleeding. Allergy: Negative for rash or hives.   Observations/Objective: Patient reports being 5'2" tall and weight of 200 pounds (although admits she has not weighed recently, per last ov note from PCP she was 228). Pleasant alert, cooperative female. Otherwise exam unavailable.   Assessment and Plan: 60 y/o female due for screening colonoscopy. No bowel concerns. Plan for colonoscopy in the near future with propofol. ASA III due to BMI (per PCP parameters BMI >40).  I have discussed the risks, alternatives, benefits with regards to but not limited to the risk of reaction to medication, bleeding, infection, perforation and the patient is agreeable to proceed. Written consent to be obtained.   Follow Up Instructions:    I discussed the assessment and treatment plan with the patient. The patient was provided an opportunity to ask questions and all were answered. The patient agreed with the plan and  demonstrated an understanding of the instructions. AVS mailed to patient's home address.   The patient was advised to call back or seek an in-person evaluation if the symptoms worsen or if the condition fails to improve as anticipated.  I provided 15 minutes of virtual face-to-face time during this encounter.   Tana Coast, PA-C

## 2021-05-11 ENCOUNTER — Other Ambulatory Visit: Payer: Self-pay

## 2021-05-11 MED ORDER — PEG 3350-KCL-NA BICARB-NACL 420 G PO SOLR: 4000.0000 mL | ORAL | 0 refills | Status: DC

## 2021-05-11 NOTE — Telephone Encounter (Signed)
Called pt, TCS scheduled for 06/08/21 at 8:30am. Rx for prep sent to pharmacy. Orders entered.  Current insurance card not in chart, pt will fax card.

## 2021-05-11 NOTE — Addendum Note (Signed)
Addended by: Corrie Mckusick on: 05/11/2021 04:46 PM   Modules accepted: Orders

## 2021-05-12 NOTE — Telephone Encounter (Signed)
Pre-op appt 06/04/21. Appt letter mailed with procedure instructions.

## 2021-05-19 NOTE — Telephone Encounter (Signed)
Insurance card hasn't been scanned into chart. Called pt, she faxed insurance card last week. She is currently in [place] and will call next week to get fax# and resend.

## 2021-05-25 ENCOUNTER — Other Ambulatory Visit: Payer: Self-pay

## 2021-05-25 NOTE — Telephone Encounter (Signed)
Insurance card has been scanned into chart. Called [employer], spoke to [place], no PA needed for TCS if done outpatient. Ref# [id number].

## 2021-06-03 ENCOUNTER — Encounter (HOSPITAL_COMMUNITY): Payer: Self-pay

## 2021-06-03 ENCOUNTER — Other Ambulatory Visit: Payer: Self-pay

## 2021-06-03 NOTE — Patient Instructions (Signed)
Judy Bryan  06/03/2021     @PREFPERIOPPHARMACY @   Your procedure is scheduled on 06/08/2021.   Report to 13/01/2021 at  [phone] AM.    Call this number if you have problems the morning of surgery:  [phone]   Remember:  Follow the diet and prep instructions given to you by the office.    Take these medicines the morning of surgery with A SIP OF WATER                                   None.    Do not wear jewelry, make-up or nail polish.  Do not wear lotions, powders, or perfumes, or deodorant.  Do not shave 48 hours prior to surgery.  Men may shave face and neck.  Do not bring valuables to the hospital.  [facility] is not responsible for any belongings or valuables.  Contacts, dentures or bridgework may not be worn into surgery.  Leave your suitcase in the car.  After surgery it may be brought to your room.  For patients admitted to the hospital, discharge time will be determined by your treatment team.  Patients discharged the day of surgery will not be allowed to drive home and must have someone with them for 24 hours.    Special instructions:   DO NOT smoke tobacco or vape for 24 hours before your procedure.  Please read over the following fact sheets that you were given. Anesthesia Post-op Instructions and Care and Recovery After Surgery      Colonoscopy, Adult, Care After This sheet gives you information about how to care for yourself after your procedure. Your health care provider may also give you more specific instructions. If you have problems or questions, contact your health care provider. What can I expect after the procedure? After the procedure, it is common to have: A small amount of blood in your stool for 24 hours after the procedure. Some gas. Mild cramping or bloating of your abdomen. Follow these instructions at home: Eating and drinking  Drink enough fluid to keep your urine pale yellow. Follow instructions from your health care  provider about eating or drinking restrictions. Resume your normal diet as instructed by your health care provider. Avoid heavy or fried foods that are hard to digest. Activity Rest as told by your health care provider. Avoid sitting for a long time without moving. Get up to take short walks every 1-2 hours. This is important to improve blood flow and breathing. Ask for help if you feel weak or unsteady. Return to your normal activities as told by your health care provider. Ask your health care provider what activities are safe for you. Managing cramping and bloating  Try walking around when you have cramps or feel bloated. Apply heat to your abdomen as told by your health care provider. Use the heat source that your health care provider recommends, such as a moist heat pack or a heating pad. Place a towel between your skin and the heat source. Leave the heat on for 20-30 minutes. Remove the heat if your skin turns bright red. This is especially important if you are unable to feel pain, heat, or cold. You may have a greater risk of getting burned. General instructions If you were given a sedative during the procedure, it can affect you for several hours. Do not drive or operate  machinery until your health care provider says that it is safe. For the first 24 hours after the procedure: Do not sign important documents. Do not drink alcohol. Do your regular daily activities at a slower pace than normal. Eat soft foods that are easy to digest. Take over-the-counter and prescription medicines only as told by your health care provider. Keep all follow-up visits as told by your health care provider. This is important. Contact a health care provider if: You have blood in your stool 2-3 days after the procedure. Get help right away if you have: More than a small spotting of blood in your stool. Large blood clots in your stool. Swelling of your abdomen. Nausea or vomiting. A fever. Increasing pain  in your abdomen that is not relieved with medicine. Summary After the procedure, it is common to have a small amount of blood in your stool. You may also have mild cramping and bloating of your abdomen. If you were given a sedative during the procedure, it can affect you for several hours. Do not drive or operate machinery until your health care provider says that it is safe. Get help right away if you have a lot of blood in your stool, nausea or vomiting, a fever, or increased pain in your abdomen. This information is not intended to replace advice given to you by your health care provider. Make sure you discuss any questions you have with your health care provider. Document Revised: 07/13/2019 Document Reviewed: 02/12/2019 Elsevier Patient Education  [place] After This sheet gives you information about how to care for yourself after your procedure. Your health care provider may also give you more specific instructions. If you have problems or questions, contact your health care provider. What can I expect after the procedure? After the procedure, it is common to have: Tiredness. Forgetfulness about what happened after the procedure. Impaired judgment for important decisions. Nausea or vomiting. Some difficulty with balance. Follow these instructions at home: For the time period you were told by your health care provider:   Rest as needed. Do not participate in activities where you could fall or become injured. Do not drive or use machinery. Do not drink alcohol. Do not take sleeping pills or medicines that cause drowsiness. Do not make important decisions or sign legal documents. Do not take care of children on your own. Eating and drinking Follow the diet that is recommended by your health care provider. Drink enough fluid to keep your urine pale yellow. If you vomit: Drink water, juice, or soup when you can drink without vomiting. Make  sure you have little or no nausea before eating solid foods. General instructions Have a responsible adult stay with you for the time you are told. It is important to have someone help care for you until you are awake and alert. Take over-the-counter and prescription medicines only as told by your health care provider. If you have sleep apnea, surgery and certain medicines can increase your risk for breathing problems. Follow instructions from your health care provider about wearing your sleep device: Anytime you are sleeping, including during daytime naps. While taking prescription pain medicines, sleeping medicines, or medicines that make you drowsy. Avoid smoking. Keep all follow-up visits as told by your health care provider. This is important. Contact a health care provider if: You keep feeling nauseous or you keep vomiting. You feel light-headed. You are still sleepy or having trouble with balance after 24 hours. You develop  a rash. You have a fever. You have redness or swelling around the IV site. Get help right away if: You have trouble breathing. You have new-onset confusion at home. Summary For several hours after your procedure, you may feel tired. You may also be forgetful and have poor judgment. Have a responsible adult stay with you for the time you are told. It is important to have someone help care for you until you are awake and alert. Rest as told. Do not drive or operate machinery. Do not drink alcohol or take sleeping pills. Get help right away if you have trouble breathing, or if you suddenly become confused. This information is not intended to replace advice given to you by your health care provider. Make sure you discuss any questions you have with your health care provider. Document Revised: 04/03/2020 Document Reviewed: 06/21/2019 Elsevier Patient Education  2022 [employer].

## 2021-06-04 ENCOUNTER — Encounter (HOSPITAL_COMMUNITY)
Admission: RE | Admit: 2021-06-04 | Discharge: 2021-06-04 | Disposition: A | Discharge status: Home / Self Care | Payer: BLUE CROSS/BLUE SHIELD | Source: Ambulatory Visit | Attending: Internal Medicine | Admitting: Internal Medicine

## 2021-06-08 ENCOUNTER — Encounter (HOSPITAL_COMMUNITY)
Admission: RE | Disposition: A | Discharge status: Home / Self Care | Payer: Self-pay | Source: Home / Self Care | Attending: Internal Medicine

## 2021-06-08 ENCOUNTER — Ambulatory Visit (HOSPITAL_COMMUNITY): Payer: BLUE CROSS/BLUE SHIELD | Admitting: Anesthesiology

## 2021-06-08 ENCOUNTER — Ambulatory Visit (HOSPITAL_COMMUNITY)
Admission: RE | Admit: 2021-06-08 | Discharge: 2021-06-08 | Disposition: A | Discharge status: Home / Self Care | Payer: BLUE CROSS/BLUE SHIELD | Attending: Internal Medicine | Admitting: Internal Medicine

## 2021-06-08 ENCOUNTER — Encounter (HOSPITAL_COMMUNITY): Payer: Self-pay

## 2021-06-08 ENCOUNTER — Other Ambulatory Visit: Payer: Self-pay

## 2021-06-08 DIAGNOSIS — Z800 Family history of malignant neoplasm of digestive organs: Secondary | ICD-10-CM | POA: Insufficient documentation

## 2021-06-08 DIAGNOSIS — Z1211 Encounter for screening for malignant neoplasm of colon: Secondary | ICD-10-CM | POA: Diagnosis present

## 2021-06-08 DIAGNOSIS — K635 Polyp of colon: Secondary | ICD-10-CM | POA: Diagnosis not present

## 2021-06-08 DIAGNOSIS — K648 Other hemorrhoids: Secondary | ICD-10-CM | POA: Insufficient documentation

## 2021-06-08 DIAGNOSIS — Z6841 Body Mass Index (BMI) 40.0 and over, adult: Secondary | ICD-10-CM | POA: Diagnosis not present

## 2021-06-08 HISTORY — PX: COLONOSCOPY WITH PROPOFOL: SHX5780

## 2021-06-08 HISTORY — PX: POLYPECTOMY: SHX5525

## 2021-06-08 SURGERY — COLONOSCOPY WITH PROPOFOL
Anesthesia: General

## 2021-06-08 MED ORDER — LIDOCAINE HCL (CARDIAC) PF 100 MG/5ML IV SOSY
PREFILLED_SYRINGE | INTRAVENOUS | Status: DC | PRN
  Administered 2021-06-08: 50 mg via INTRAVENOUS

## 2021-06-08 MED ORDER — PROPOFOL 1000 MG/100ML IV EMUL
INTRAVENOUS | Status: AC
  Filled 2021-06-08: qty 100

## 2021-06-08 MED ORDER — LACTATED RINGERS IV SOLN: INTRAVENOUS | Status: DC

## 2021-06-08 MED ORDER — PROPOFOL 500 MG/50ML IV EMUL
INTRAVENOUS | Status: DC | PRN
  Administered 2021-06-08: 200 ug/kg/min via INTRAVENOUS

## 2021-06-08 MED ORDER — LIDOCAINE HCL (PF) 2 % IJ SOLN
INTRAMUSCULAR | Status: AC
  Filled 2021-06-08: qty 5

## 2021-06-08 MED ORDER — PROPOFOL 10 MG/ML IV BOLUS
INTRAVENOUS | Status: DC | PRN
  Administered 2021-06-08: 100 mg via INTRAVENOUS

## 2021-06-08 NOTE — Transfer of Care (Signed)
Immediate Anesthesia Transfer of Care Note  Patient: Judy Bryan  Procedure(s) Performed: COLONOSCOPY WITH PROPOFOL POLYPECTOMY  Patient Location: PACU  Anesthesia Type:General  Level of Consciousness: awake, alert , oriented and patient cooperative  Airway & Oxygen Therapy: Patient Spontanous Breathing  Post-op Assessment: Report given to RN, Post -op Vital signs reviewed and stable and Patient moving all extremities X 4  Post vital signs: Reviewed and stable  Last Vitals:  Vitals Value Taken Time  BP 91/52 06/08/21 0824  Temp 36.7 C 06/08/21 0824  Pulse 86 06/08/21 0824  Resp 19 06/08/21 0824  SpO2 97 % 06/08/21 0824    Last Pain:  Vitals:   06/08/21 0824  TempSrc: Oral  PainSc:          Complications: No notable events documented.

## 2021-06-08 NOTE — H&P (Signed)
Primary Care Physician:  April Manson, NP Primary Gastroenterologist:  Dr. Marletta Lor  Pre-Procedure History & Physical: HPI:  Judy Bryan is a 60 y.o. female is here for a colonoscopy for colon cancer screening purposes.  Patient grandmother has colon cancer in her 53's.  No melena or hematochezia.  No abdominal pain or unintentional weight loss.  No change in bowel habits.  Overall feels well from a GI standpoint.  Past Medical History:  Diagnosis Date   Frequency of urination    History of kidney stones    Left ureteral calculus    Urgency of urination     Past Surgical History:  Procedure Laterality Date   CYSTOSCOPY WITH RETROGRADE PYELOGRAM, URETEROSCOPY AND STENT PLACEMENT Left 05/10/2014   Procedure: CYSTOSCOPY WITH RETROGRADE PYELOGRAM, URETEROSCOPY AND DOUBLE J STENT PLACEMENT;  Surgeon: Chelsea Aus, MD;  Location: [facility];  Service: Urology;  Laterality: Left;   EXTRACORPOREAL SHOCK WAVE LITHOTRIPSY Left 01-24-2014   HOLMIUM LASER APPLICATION Left 05/10/2014   Procedure: HOLMIUM LASER APPLICATION;  Surgeon: Chelsea Aus, MD;  Location: [facility];  Service: Urology;  Laterality: Left;   STONE EXTRACTION WITH BASKET Left 05/10/2014   Procedure: STONE EXTRACTION WITH BASKET;  Surgeon: Chelsea Aus, MD;  Location: [facility];  Service: Urology;  Laterality: Left;   WISDOM TOOTH EXTRACTION  1977    Prior to Admission medications   Medication Sig Start Date End Date Taking? Authorizing Provider  acetaminophen (TYLENOL) 500 MG tablet Take 1,000 mg by mouth every 6 (six) hours as needed for mild pain.   Yes [provider]  Cholecalciferol (VITAMIN D-3) 125 MCG (5000 UT) TABS Take 20,000 tablets by mouth daily. With magnesium   Yes [provider]  naproxen sodium (ALEVE) 220 MG tablet Take 220 mg by mouth 2 (two) times daily as needed (pain).   Yes [provider]  polyethylene  glycol-electrolytes (TRILYTE) 420 g solution Take 4,000 mLs by mouth as directed. 05/11/21  Yes Lanelle Bal, DO    Allergies as of 05/11/2021   (No Known Allergies)    Family History  Problem Relation Age of Onset   Colon cancer Paternal Grandmother        in her 79s    Social History   Socioeconomic History   Marital status: Divorced    Spouse name: Not on file   Number of children: Not on file   Years of education: Not on file   Highest education level: Not on file  Occupational History   Not on file  Tobacco Use   Smoking status: Never   Smokeless tobacco: Never  Vaping Use   Vaping Use: Never used  Substance and Sexual Activity   Alcohol use: No   Drug use: No   Sexual activity: Yes  Other Topics Concern   Not on file  Social History Narrative   Not on file   Social Determinants of Health   Financial Resource Strain: Not on file  Food Insecurity: Not on file  Transportation Needs: Not on file  Physical Activity: Not on file  Stress: Not on file  Social Connections: Not on file  Intimate Partner Violence: Not on file    Review of Systems: See HPI, otherwise negative ROS  Physical Exam: Vital signs in last 24 hours: Temp:  [98.3 F (36.8 C)] 98.3 F (36.8 C) (11/07 0703) Pulse Rate:  [88] 88 (11/07 0703) Resp:  [16] 16 (11/07 0703)  BP: (128)/(86) 128/86 (11/07 0703) SpO2:  [99 %] 99 % (11/07 0703)   General:   Alert,  Well-developed, well-nourished, pleasant and cooperative in NAD Head:  Normocephalic and atraumatic. Eyes:  Sclera clear, no icterus.   Conjunctiva pink. Ears:  Normal auditory acuity. Nose:  No deformity, discharge,  or lesions. Mouth:  No deformity or lesions, dentition normal. Neck:  Supple; no masses or thyromegaly. Lungs:  Clear throughout to auscultation.   No wheezes, crackles, or rhonchi. No acute distress. Heart:  Regular rate and rhythm; no murmurs, clicks, rubs,  or gallops. Abdomen:  Soft, nontender and  nondistended. No masses, hepatosplenomegaly or hernias noted. Normal bowel sounds, without guarding, and without rebound.   Msk:  Symmetrical without gross deformities. Normal posture. Extremities:  Without clubbing or edema. Neurologic:  Alert and  oriented x4;  grossly normal neurologically. Skin:  Intact without significant lesions or rashes. Cervical Nodes:  No significant cervical adenopathy. Psych:  Alert and cooperative. Normal mood and affect.  Impression/Plan: Judy Bryan is here for a colonoscopy to be performed for colon cancer screening purposes.  The risks of the procedure including infection, bleed, or perforation as well as benefits, limitations, alternatives and imponderables have been reviewed with the patient. Questions have been answered. All parties agreeable.

## 2021-06-08 NOTE — Anesthesia Preprocedure Evaluation (Addendum)
Anesthesia Evaluation  Patient identified by MRN, date of birth, ID band Patient awake    Reviewed: Allergy & Precautions, NPO status , Patient's Chart, lab work & pertinent test results  Airway Mallampati: II  TM Distance: >3 FB Neck ROM: Full    Dental  (+) Dental Advisory Given, Teeth Intact   Pulmonary neg pulmonary ROS,    Pulmonary exam normal breath sounds clear to auscultation       Cardiovascular Exercise Tolerance: Good negative cardio ROS Normal cardiovascular exam Rhythm:Regular Rate:Normal     Neuro/Psych negative neurological ROS  negative psych ROS   GI/Hepatic negative GI ROS, Neg liver ROS,   Endo/Other  Morbid obesity  Renal/GU negative Renal ROS  negative genitourinary   Musculoskeletal negative musculoskeletal ROS (+)   Abdominal   Peds negative pediatric ROS (+)  Hematology negative hematology ROS (+)   Anesthesia Other Findings   Reproductive/Obstetrics negative OB ROS                            Anesthesia Physical Anesthesia Plan  ASA: 3  Anesthesia Plan: General   Post-op Pain Management:    Induction: Intravenous  PONV Risk Score and Plan: TIVA  Airway Management Planned: Nasal Cannula and Natural Airway  Additional Equipment:   Intra-op Plan:   Post-operative Plan:   Informed Consent: I have reviewed the patients History and Physical, chart, labs and discussed the procedure including the risks, benefits and alternatives for the proposed anesthesia with the patient or authorized representative who has indicated his/her understanding and acceptance.     Dental advisory given  Plan Discussed with: CRNA and Surgeon  Anesthesia Plan Comments:        Anesthesia Quick Evaluation

## 2021-06-08 NOTE — Op Note (Signed)
[facility] Patient Name: Judy Bryan Procedure Date: 06/08/2021 7:07 AM MRN: [id number] Date of Birth: 09-27-1960 Attending MD: Elon Alas. Abbey Chatters DO CSN: [id number] Age: 60 Admit Type: Outpatient Procedure:                Colonoscopy Indications:              Screening for colorectal malignant neoplasm Providers:                Elon Alas. Abbey Chatters, DO, Lambert Mody, Dereck Leep, Technician Referring MD:              Medicines:                See the Anesthesia note for documentation of the                            administered medications Complications:            No immediate complications. Estimated Blood Loss:      Procedure:                Pre-Anesthesia Assessment:                           - The anesthesia plan was to use monitored                            anesthesia care (MAC).                           After obtaining informed consent, the colonoscope                            was passed under direct vision. Throughout the                            procedure, the patient's blood pressure, pulse, and                            oxygen saturations were monitored continuously. The                            [id number]-HQ190L ([id number]) scope was introduced through                            the anus and advanced to the the cecum, identified                            by appendiceal orifice and ileocecal valve. The                            colonoscopy was performed without difficulty. The                            patient tolerated the procedure well. The quality  of the bowel preparation was evaluated using the                            BBPS [name]                            of: Right Colon = 3, Transverse Colon = 3 and Left                            Colon = 3 (entire mucosa seen well with no residual                            staining, small fragments of stool  or opaque                            liquid). The total BBPS score equals 9. Scope In: 8:03:06 AM Scope Out: 8:18:16 AM Scope Withdrawal Time: 0 hours 13 minutes 40 seconds  Total Procedure Duration: 0 hours 15 minutes 10 seconds  Findings:      The perianal and digital rectal examinations were normal.      Non-bleeding internal hemorrhoids were found during endoscopy.      Five sessile polyps were found in the sigmoid colon. The polyps were 4       to 6 mm in size. These polyps were removed with a cold snare. Resection       and retrieval were complete.      The exam was otherwise without abnormality. Impression:               - Non-bleeding internal hemorrhoids.                           - Five 4 to 6 mm polyps in the sigmoid colon,                            removed with a cold snare. Resected and retrieved.                           - The examination was otherwise normal. Moderate Sedation:      Per Anesthesia Care Recommendation:           - Patient has a contact number available for                            emergencies. The signs and symptoms of potential                            delayed complications were discussed with the                            patient. Return to normal activities tomorrow.                            Written discharge instructions were provided to the  patient.                           - Resume previous diet.                           - Continue present medications.                           - Await pathology results.                           - Repeat colonoscopy in 5-10 years for surveillance                            depending on pathology.                           - Return to GI clinic PRN. Procedure Code(s):        --- Professional ---                           [phone], Colonoscopy, flexible; with removal of                            tumor(s), polyp(s), or other lesion(s) by snare                             technique Diagnosis Code(s):        --- Professional ---                           Z12.11, Encounter for screening for malignant                            neoplasm of colon                           K63.5, Polyp of colon                           K64.8, Other hemorrhoids CPT copyright 2019 American Medical Association. All rights reserved. The codes documented in this report are preliminary and upon coder review may  be revised to meet current compliance requirements. Elon Alas. Abbey Chatters, DO [place] Abbey Chatters, DO 06/08/2021 8:24:31 AM This report has been signed electronically. Number of Addenda: 0

## 2021-06-08 NOTE — Anesthesia Postprocedure Evaluation (Signed)
Anesthesia Post Note  Patient: Judy Bryan  Procedure(s) Performed: COLONOSCOPY WITH PROPOFOL POLYPECTOMY  Patient location during evaluation: Phase II Anesthesia Type: General Level of consciousness: awake and alert and oriented Pain management: pain level controlled Vital Signs Assessment: post-procedure vital signs reviewed and stable Respiratory status: spontaneous breathing, nonlabored ventilation and respiratory function stable Cardiovascular status: blood pressure returned to baseline and stable Postop Assessment: no apparent nausea or vomiting Anesthetic complications: no   No notable events documented.   Last Vitals:  Vitals:   06/08/21 0824 06/08/21 0829  BP: (!) 91/52 (!) 115/93  Pulse: 86   Resp: 19   Temp: 36.7 C   SpO2: 97%     Last Pain:  Vitals:   06/08/21 0829  TempSrc:   PainSc: 0-No pain                 [name] C Mckena Chern

## 2021-06-08 NOTE — Discharge Instructions (Signed)
  Colonoscopy Discharge Instructions  Read the instructions outlined below and refer to this sheet in the next few weeks. These discharge instructions provide you with general information on caring for yourself after you leave the hospital. Your doctor may also give you specific instructions. While your treatment has been planned according to the most current medical practices available, unavoidable complications occasionally occur.   ACTIVITY You may resume your regular activity, but move at a slower pace for the next 24 hours.  Take frequent rest periods for the next 24 hours.  Walking will help get rid of the air and reduce the bloated feeling in your belly (abdomen).  No driving for 24 hours (because of the medicine (anesthesia) used during the test).   Do not sign any important legal documents or operate any machinery for 24 hours (because of the anesthesia used during the test).  NUTRITION Drink plenty of fluids.  You may resume your normal diet as instructed by your doctor.  Begin with a light meal and progress to your normal diet. Heavy or fried foods are harder to digest and may make you feel sick to your stomach (nauseated).  Avoid alcoholic beverages for 24 hours or as instructed.  MEDICATIONS You may resume your normal medications unless your doctor tells you otherwise.  WHAT YOU CAN EXPECT TODAY Some feelings of bloating in the abdomen.  Passage of more gas than usual.  Spotting of blood in your stool or on the toilet paper.  IF YOU HAD POLYPS REMOVED DURING THE COLONOSCOPY: No aspirin products for 7 days or as instructed.  No alcohol for 7 days or as instructed.  Eat a soft diet for the next 24 hours.  FINDING OUT THE RESULTS OF YOUR TEST Not all test results are available during your visit. If your test results are not back during the visit, make an appointment with your caregiver to find out the results. Do not assume everything is normal if you have not heard from your  caregiver or the medical facility. It is important for you to follow up on all of your test results.  SEEK IMMEDIATE MEDICAL ATTENTION IF: You have more than a spotting of blood in your stool.  Your belly is swollen (abdominal distention).  You are nauseated or vomiting.  You have a temperature over 101.  You have abdominal pain or discomfort that is severe or gets worse throughout the day.   Your colonoscopy revealed 5 polyp(s) which I removed successfully. Await pathology results, my office will contact you. I recommend repeating colonoscopy in 5-10 years for surveillance purposes depending on pathology results. Otherwise follow up with GI as needed.    I hope you have a great rest of your week!  Hennie Duos. Marletta Lor, D.O. Gastroenterology and Hepatology [facility]

## 2021-06-09 LAB — SURGICAL PATHOLOGY

## 2021-06-11 ENCOUNTER — Encounter (HOSPITAL_COMMUNITY): Payer: Self-pay | Admitting: Internal Medicine

## 2023-05-20 ENCOUNTER — Encounter (HOSPITAL_COMMUNITY): Payer: Self-pay | Admitting: *Deleted

## 2023-05-20 ENCOUNTER — Emergency Department (HOSPITAL_COMMUNITY)
Admission: EM | Admit: 2023-05-20 | Discharge: 2023-05-20 | Disposition: A | Discharge status: Home / Self Care | Payer: BLUE CROSS/BLUE SHIELD | Attending: Emergency Medicine | Admitting: Emergency Medicine

## 2023-05-20 ENCOUNTER — Other Ambulatory Visit: Payer: Self-pay

## 2023-05-20 ENCOUNTER — Emergency Department (HOSPITAL_COMMUNITY): Payer: BLUE CROSS/BLUE SHIELD

## 2023-05-20 DIAGNOSIS — W01190A Fall on same level from slipping, tripping and stumbling with subsequent striking against furniture, initial encounter: Secondary | ICD-10-CM | POA: Insufficient documentation

## 2023-05-20 DIAGNOSIS — S0101XA Laceration without foreign body of scalp, initial encounter: Secondary | ICD-10-CM | POA: Diagnosis present

## 2023-05-20 MED ORDER — NAPROXEN 500 MG PO TABS: 500.0000 mg | ORAL_TABLET | Freq: Two times a day (BID) | ORAL | 0 refills | Status: DC

## 2023-05-20 MED ORDER — LIDOCAINE-EPINEPHRINE 2 %-1:100000 IJ SOLN
20.0000 mL | Freq: Once | INTRAMUSCULAR | Status: AC
Start: 2023-05-20 — End: 2023-05-20
  Administered 2023-05-20: 20 mL via INTRADERMAL
  Filled 2023-05-20: qty 20

## 2023-05-20 MED ORDER — MUPIROCIN 2 % EX OINT
TOPICAL_OINTMENT | Freq: Two times a day (BID) | CUTANEOUS | Status: DC
  Filled 2023-05-20: qty 22

## 2023-05-20 MED ORDER — LIDOCAINE-EPINEPHRINE (PF) 2 %-1:200000 IJ SOLN
INTRAMUSCULAR | Status: AC
  Filled 2023-05-20: qty 20

## 2023-05-20 NOTE — ED Triage Notes (Signed)
Pt with fell backwards after tripping on the threshold at entrance of home today, pt states she hit back of her head on a wooden kitchen door. Denies blood thinners or LOC.

## 2023-05-20 NOTE — ED Provider Notes (Signed)
[place] EMERGENCY DEPARTMENT AT [facility] Provider Note   CSN: [id number] Arrival date & time: 05/20/23  1449     History  Chief Complaint  Patient presents with   Marletta Lor    Judy Bryan is a 62 y.o. female.   Fall   This patient is a 62 year old female, she presents with a complaint of a fall that occurred about 45 minutes ago when she got home from a long car ride from [place].  She was trying to walk into her house carrying a heavy load when she lost her balance on the threshold of the house falling backwards and striking her head on a chair that was on the porch.  There was no loss of consciousness, she did have some bleeding from the back of her head and very cautiously made her self to a phone where she called for help, they came to help her, she feels like she is back to her normal self, there was no dizziness no changes in vision, no chest pain shortness of breath nausea vomiting or seizures.  She does notice that there is a cut on the back of her scalp.    Home Medications Prior to Admission medications   Medication Sig Start Date End Date Taking? Authorizing Provider  naproxen (NAPROSYN) 500 MG tablet Take 1 tablet (500 mg total) by mouth 2 (two) times daily with a meal. 05/20/23  Yes Eber Hong, MD  acetaminophen (TYLENOL) 500 MG tablet Take 1,000 mg by mouth every 6 (six) hours as needed for mild pain.    [provider]  Cholecalciferol (VITAMIN D-3) 125 MCG (5000 UT) TABS Take 20,000 tablets by mouth daily. With magnesium    [provider]  naproxen sodium (ALEVE) 220 MG tablet Take 220 mg by mouth 2 (two) times daily as needed (pain).    [provider]  polyethylene glycol-electrolytes (TRILYTE) 420 g solution Take 4,000 mLs by mouth as directed. 05/11/21   Lanelle Bal, DO      Allergies    Patient has no known allergies.    Review of Systems   Review of Systems  All other systems reviewed and are  negative.   Physical Exam Updated Vital Signs BP (!) 138/97 (BP Location: Right Arm)   Pulse 63   Temp 97.8 F (36.6 C) (Oral)   Resp 16   Ht 1.575 m (5\' 2" )   Wt 86.2 kg   SpO2 100%   BMI 34.75 kg/m  Physical Exam Vitals and nursing note reviewed.  Constitutional:      General: She is not in acute distress.    Appearance: She is well-developed.  HENT:     Head: Normocephalic.     Comments: Small laceration to the posterior scalp, nonbleeding, no surrounding hematoma, no depression    Mouth/Throat:     Pharynx: No oropharyngeal exudate.  Eyes:     General: No scleral icterus.       Right eye: No discharge.        Left eye: No discharge.     Conjunctiva/sclera: Conjunctivae normal.     Pupils: Pupils are equal, round, and reactive to light.  Neck:     Thyroid: No thyromegaly.     Vascular: No JVD.  Cardiovascular:     Rate and Rhythm: Normal rate and regular rhythm.     Heart sounds: Normal heart sounds. No murmur heard.    No friction rub. No gallop.  Pulmonary:  Effort: Pulmonary effort is normal. No respiratory distress.     Breath sounds: Normal breath sounds. No wheezing or rales.  Abdominal:     General: Bowel sounds are normal. There is no distension.     Palpations: Abdomen is soft. There is no mass.     Tenderness: There is no abdominal tenderness.  Musculoskeletal:        General: No tenderness. Normal range of motion.     Cervical back: Normal range of motion and neck supple.     Right lower leg: No edema.     Left lower leg: No edema.  Lymphadenopathy:     Cervical: No cervical adenopathy.  Skin:    General: Skin is warm and dry.     Findings: No erythema or rash.  Neurological:     General: No focal deficit present.     Mental Status: She is alert.     Coordination: Coordination normal.     Comments: Speech is clear, cranial nerves III through XII are intact, memory is intact, strength is normal in all 4 extremities including grips and  strength at the bilateral thighs, knees and ankles to extention and flexion, sensation is intact to light touch and pinprick in all 4 extremities. Coordination as tested by finger-nose-finger is normal, no limb ataxia. Normal gait, normal reflexes at the patellar tendons bilaterally  Psychiatric:        Behavior: Behavior normal.     ED Results / Procedures / Treatments   Labs (all labs ordered are listed, but only abnormal results are displayed) Labs Reviewed - No data to display  EKG None  Radiology CT Head Wo Contrast  Result Date: 05/20/2023 CLINICAL DATA:  Head trauma, focal neuro findings (Age 55-64y) EXAM: CT HEAD WITHOUT CONTRAST TECHNIQUE: Contiguous axial images were obtained from the base of the skull through the vertex without intravenous contrast. RADIATION DOSE REDUCTION: This exam was performed according to the departmental dose-optimization program which includes automated exposure control, adjustment of the mA and/or kV according to patient size and/or use of iterative reconstruction technique. COMPARISON:  None Available. FINDINGS: Brain: No evidence of acute infarction, hemorrhage, hydrocephalus, extra-axial collection or mass lesion/mass effect. Vascular: No hyperdense vessel identified. Skull: Normal. Negative for fracture or focal lesion. Sinuses/Orbits: No acute finding. Other: No mastoid effusions. IMPRESSION: No evidence of acute intracranial abnormality. Electronically Signed   By: Feliberto Harts M.D.   On: 05/20/2023 16:44    Procedures .Marland KitchenLaceration Repair  Date/Time: 05/20/2023 4:52 PM  Performed by: Eber Hong, MD Authorized by: Eber Hong, MD   Consent:    Consent obtained:  Verbal   Consent given by:  Patient   Risks, benefits, and alternatives were discussed: yes     Risks discussed:  Infection, pain, retained foreign body, poor cosmetic result, need for additional repair, poor wound healing and nerve damage   Alternatives discussed:  No  treatment, delayed treatment, observation and referral Universal protocol:    Procedure explained and questions answered to patient or proxy's satisfaction: yes     Immediately prior to procedure, a time out was called: yes     Patient identity confirmed:  Verbally with patient Anesthesia:    Anesthesia method:  None (Patient declines anesthesia) Laceration details:    Location:  Scalp   Scalp location:  Occipital   Length (cm):  1.5   Depth (mm):  3 Pre-procedure details:    Preparation:  Patient was prepped and draped in usual sterile fashion and imaging  obtained to evaluate for foreign bodies Exploration:    Limited defect created (wound extended): no     Imaging obtained: x-ray     Imaging outcome: foreign body not noted     Wound exploration: wound explored through full range of motion and entire depth of wound visualized     Wound extent: fascia not violated     Contaminated: no   Treatment:    Amount of cleaning:  Extensive   Debridement:  None Skin repair:    Repair method:  Staples   Number of staples:  1 Approximation:    Approximation:  Close Repair type:    Repair type:  Simple Post-procedure details:    Dressing:  Antibiotic ointment   Procedure completion:  Tolerated well, no immediate complications Comments:           Medications Ordered in ED Medications  lidocaine-EPINEPHrine (XYLOCAINE W/EPI) 2 %-1:200000 (PF) injection (  Not Given 05/20/23 1622)  mupirocin ointment (BACTROBAN) 2 % (has no administration in time range)  lidocaine-EPINEPHrine (XYLOCAINE W/EPI) 2 %-1:100000 (with pres) injection 20 mL (20 mLs Intradermal Given 05/20/23 1611)    ED Course/ Medical Decision Making/ A&P                                 Medical Decision Making Amount and/or Complexity of Data Reviewed Radiology: ordered.  Risk Prescription drug management.   There is no neck tenderness, no cervical spine tenderness, no focal neurologic deficits, the patient has  had a fall with a head injury and a laceration, CT scan ordered due to the patient's age and mechanism of high impact fall.  Will repair wound after it is cleansed.  Patient is agreeable  Imaging: CT scan of the head without contrast performed, I personally viewed and interpret the images which show no acute findings  I agree with radiologist interpretation.          Final Clinical Impression(s) / ED Diagnoses Final diagnoses:  Scalp laceration, initial encounter    Rx / DC Orders ED Discharge Orders          Ordered    naproxen (NAPROSYN) 500 MG tablet  2 times daily with meals        05/20/23 1651              Eber Hong, MD 05/20/23 [phone]

## 2023-05-20 NOTE — ED Notes (Signed)
Wound cleaned with sterile water

## 2023-05-20 NOTE — Discharge Instructions (Signed)
Take Naprosyn twice a day as needed, apply topical antibiotic to the wound, the staple can come out in 10 days at your doctor's office or an urgent care, ER for severe or worsening symptoms, the CT scan was reassuring and showed no signs of brain injury

## 2023-11-22 ENCOUNTER — Other Ambulatory Visit: Payer: Self-pay

## 2023-11-22 ENCOUNTER — Encounter (HOSPITAL_COMMUNITY): Payer: Self-pay | Admitting: *Deleted

## 2023-11-22 ENCOUNTER — Emergency Department (HOSPITAL_COMMUNITY)

## 2023-11-22 ENCOUNTER — Observation Stay (HOSPITAL_COMMUNITY)
Admission: EM | Admit: 2023-11-22 | Discharge: 2023-11-25 | Disposition: A | Discharge status: Home / Self Care | Source: Ambulatory Visit | Attending: Family Medicine | Admitting: Family Medicine

## 2023-11-22 DIAGNOSIS — M1990 Unspecified osteoarthritis, unspecified site: Secondary | ICD-10-CM | POA: Diagnosis present

## 2023-11-22 DIAGNOSIS — Z6828 Body mass index (BMI) 28.0-28.9, adult: Secondary | ICD-10-CM

## 2023-11-22 DIAGNOSIS — N179 Acute kidney failure, unspecified: Secondary | ICD-10-CM | POA: Diagnosis not present

## 2023-11-22 DIAGNOSIS — R030 Elevated blood-pressure reading, without diagnosis of hypertension: Secondary | ICD-10-CM | POA: Insufficient documentation

## 2023-11-22 DIAGNOSIS — E538 Deficiency of other specified B group vitamins: Secondary | ICD-10-CM | POA: Diagnosis present

## 2023-11-22 DIAGNOSIS — Z87442 Personal history of urinary calculi: Secondary | ICD-10-CM | POA: Insufficient documentation

## 2023-11-22 DIAGNOSIS — E669 Obesity, unspecified: Secondary | ICD-10-CM | POA: Diagnosis not present

## 2023-11-22 DIAGNOSIS — N200 Calculus of kidney: Secondary | ICD-10-CM | POA: Diagnosis present

## 2023-11-22 DIAGNOSIS — Z96652 Presence of left artificial knee joint: Secondary | ICD-10-CM | POA: Diagnosis present

## 2023-11-22 DIAGNOSIS — D519 Vitamin B12 deficiency anemia, unspecified: Secondary | ICD-10-CM | POA: Diagnosis not present

## 2023-11-22 DIAGNOSIS — E876 Hypokalemia: Secondary | ICD-10-CM | POA: Insufficient documentation

## 2023-11-22 DIAGNOSIS — R799 Abnormal finding of blood chemistry, unspecified: Secondary | ICD-10-CM | POA: Diagnosis present

## 2023-11-22 DIAGNOSIS — D649 Anemia, unspecified: Secondary | ICD-10-CM | POA: Diagnosis present

## 2023-11-22 DIAGNOSIS — T452X1A Poisoning by vitamins, accidental (unintentional), initial encounter: Principal | ICD-10-CM | POA: Diagnosis present

## 2023-11-22 DIAGNOSIS — Z800 Family history of malignant neoplasm of digestive organs: Secondary | ICD-10-CM

## 2023-11-22 DIAGNOSIS — Z79899 Other long term (current) drug therapy: Secondary | ICD-10-CM | POA: Diagnosis not present

## 2023-11-22 DIAGNOSIS — K760 Fatty (change of) liver, not elsewhere classified: Secondary | ICD-10-CM | POA: Diagnosis present

## 2023-11-22 DIAGNOSIS — I10 Essential (primary) hypertension: Secondary | ICD-10-CM | POA: Diagnosis present

## 2023-11-22 DIAGNOSIS — R509 Fever, unspecified: Secondary | ICD-10-CM | POA: Diagnosis not present

## 2023-11-22 DIAGNOSIS — E8809 Other disorders of plasma-protein metabolism, not elsewhere classified: Secondary | ICD-10-CM | POA: Diagnosis present

## 2023-11-22 DIAGNOSIS — K219 Gastro-esophageal reflux disease without esophagitis: Secondary | ICD-10-CM | POA: Diagnosis not present

## 2023-11-22 DIAGNOSIS — E663 Overweight: Secondary | ICD-10-CM | POA: Diagnosis present

## 2023-11-22 LAB — CBC WITH DIFFERENTIAL/PLATELET
Abs Immature Granulocytes: 0.01 10*3/uL (ref 0.00–0.07)
Basophils Absolute: 0 10*3/uL (ref 0.0–0.1)
Basophils Relative: 1 %
Eosinophils Absolute: 0.3 10*3/uL (ref 0.0–0.5)
Eosinophils Relative: 5 %
HCT: 29 % — ABNORMAL LOW (ref 36.0–46.0)
Hemoglobin: 9.8 g/dL — ABNORMAL LOW (ref 12.0–15.0)
Immature Granulocytes: 0 %
Lymphocytes Relative: 36 %
Lymphs Abs: 1.8 10*3/uL (ref 0.7–4.0)
MCH: 30.9 pg (ref 26.0–34.0)
MCHC: 33.8 g/dL (ref 30.0–36.0)
MCV: 91.5 fL (ref 80.0–100.0)
Monocytes Absolute: 0.4 10*3/uL (ref 0.1–1.0)
Monocytes Relative: 8 %
Neutro Abs: 2.5 10*3/uL (ref 1.7–7.7)
Neutrophils Relative %: 50 %
Platelets: 246 10*3/uL (ref 150–400)
RBC: 3.17 MIL/uL — ABNORMAL LOW (ref 3.87–5.11)
RDW: 11.5 % (ref 11.5–15.5)
WBC: 5.1 10*3/uL (ref 4.0–10.5)
nRBC: 0 % (ref 0.0–0.2)

## 2023-11-22 LAB — COMPREHENSIVE METABOLIC PANEL WITH GFR
ALT: 17 U/L (ref 0–44)
AST: 22 U/L (ref 15–41)
Albumin: 3.6 g/dL (ref 3.5–5.0)
Alkaline Phosphatase: 51 U/L (ref 38–126)
Anion gap: 10 (ref 5–15)
BUN: 34 mg/dL — ABNORMAL HIGH (ref 8–23)
CO2: 26 mmol/L (ref 22–32)
Calcium: 12.5 mg/dL — ABNORMAL HIGH (ref 8.9–10.3)
Chloride: 104 mmol/L (ref 98–111)
Creatinine, Ser: 2.52 mg/dL — ABNORMAL HIGH (ref 0.44–1.00)
GFR, Estimated: 21 mL/min — ABNORMAL LOW (ref >60)
Glucose, Bld: 91 mg/dL (ref 70–99)
Potassium: 2.9 mmol/L — ABNORMAL LOW (ref 3.5–5.1)
Sodium: 140 mmol/L (ref 135–145)
Total Bilirubin: 0.6 mg/dL (ref 0.0–1.2)
Total Protein: 6.4 g/dL — ABNORMAL LOW (ref 6.5–8.1)

## 2023-11-22 LAB — VITAMIN D 25 HYDROXY (VIT D DEFICIENCY, FRACTURES): Vit D, 25-Hydroxy: >150 ng/mL — ABNORMAL HIGH (ref 30–100)

## 2023-11-22 LAB — LIPASE, BLOOD: Lipase: 36 U/L (ref 11–51)

## 2023-11-22 LAB — CREATININE, URINE, RANDOM: Creatinine, Urine: 70 mg/dL

## 2023-11-22 LAB — HIV ANTIBODY (ROUTINE TESTING W REFLEX): HIV Screen 4th Generation wRfx: NONREACTIVE

## 2023-11-22 LAB — MAGNESIUM: Magnesium: 1.8 mg/dL (ref 1.7–2.4)

## 2023-11-22 LAB — SODIUM, URINE, RANDOM: Sodium, Ur: 90 mmol/L

## 2023-11-22 MED ORDER — SODIUM CHLORIDE 0.9 % IV BOLUS
1000.0000 mL | Freq: Once | INTRAVENOUS | Status: AC
  Administered 2023-11-22: 1000 mL via INTRAVENOUS

## 2023-11-22 MED ORDER — POTASSIUM CHLORIDE CRYS ER 20 MEQ PO TBCR
40.0000 meq | EXTENDED_RELEASE_TABLET | Freq: Once | ORAL | Status: AC
  Administered 2023-11-22: 40 meq via ORAL
  Filled 2023-11-22: qty 2

## 2023-11-22 MED ORDER — SODIUM CHLORIDE 0.9 % IV SOLN: INTRAVENOUS | Status: AC

## 2023-11-22 MED ORDER — PANTOPRAZOLE SODIUM 40 MG PO TBEC
40.0000 mg | DELAYED_RELEASE_TABLET | Freq: Every day | ORAL | Status: DC
  Administered 2023-11-22 – 2023-11-25 (×4): 40 mg via ORAL
  Filled 2023-11-22 (×4): qty 1

## 2023-11-22 MED ORDER — SODIUM CHLORIDE 0.9 % IV SOLN: INTRAVENOUS | Status: DC

## 2023-11-22 MED ORDER — HYDRALAZINE HCL 20 MG/ML IJ SOLN: 10.0000 mg | INTRAMUSCULAR | Status: DC | PRN

## 2023-11-22 MED ORDER — ACETAMINOPHEN 325 MG PO TABS
650.0000 mg | ORAL_TABLET | Freq: Four times a day (QID) | ORAL | Status: DC | PRN
  Administered 2023-11-24 – 2023-11-25 (×4): 650 mg via ORAL
  Filled 2023-11-22 (×4): qty 2

## 2023-11-22 MED ORDER — ONDANSETRON HCL 4 MG/2ML IJ SOLN
4.0000 mg | Freq: Four times a day (QID) | INTRAMUSCULAR | Status: DC | PRN
  Administered 2023-11-24: 4 mg via INTRAVENOUS
  Filled 2023-11-22: qty 2

## 2023-11-22 MED ORDER — POTASSIUM CHLORIDE 10 MEQ/100ML IV SOLN
10.0000 meq | INTRAVENOUS | Status: AC
  Administered 2023-11-22 (×4): 10 meq via INTRAVENOUS
  Filled 2023-11-22: qty 100

## 2023-11-22 MED ORDER — ACETAMINOPHEN 650 MG RE SUPP: 650.0000 mg | Freq: Four times a day (QID) | RECTAL | Status: DC | PRN

## 2023-11-22 MED ORDER — ONDANSETRON HCL 4 MG PO TABS: 4.0000 mg | ORAL_TABLET | Freq: Four times a day (QID) | ORAL | Status: DC | PRN

## 2023-11-22 MED ORDER — HEPARIN SODIUM (PORCINE) 5000 UNIT/ML IJ SOLN
5000.0000 [IU] | Freq: Three times a day (TID) | INTRAMUSCULAR | Status: DC
  Administered 2023-11-22 – 2023-11-25 (×10): 5000 [IU] via SUBCUTANEOUS
  Filled 2023-11-22 (×9): qty 1

## 2023-11-22 NOTE — ED Triage Notes (Signed)
 Pt states she went to see her PCP yesterday and had some blood work drawn and was told this am to come to ED due to calcium level 13.5  Pt states she has been feeling tired, weak, headache and unable to eat for the last week

## 2023-11-22 NOTE — ED Provider Notes (Signed)
 [place] [facility] AT [facility] Provider Note   CSN: [id number] Arrival date & time: 11/22/23  0645     History  Chief Complaint  Patient presents with   abnormal labs    Judy Bryan is a 63 y.o. female.  HPI Patient presents for abnormal lab results.  Medical history includes nephrolithiasis, arthritis.  For the past month, she has been having fatigue.  She has had intermittent nausea and vomiting over the past 3 weeks.  With this, she has had poor appetite and poor p.o. intake.  She has had intermittent headaches.  Because of these recent symptoms, she did schedule a PCP appointment for yesterday.  During that appointment, she did get outpatient lab work drawn.  She was informed of abnormal results and advised to come to the ED.  Outpatient lab work was notable for renal failure and hypercalcemia.  Patient states that she does take large amounts of vitamin D .  She denies use of any other supplements.  She takes 70 mg of diclofenac twice daily.  She denies any recent abdominal pain or chest pain.      Home Medications Prior to Admission medications   Medication Sig Start Date End Date Taking? Authorizing Provider  diclofenac (VOLTAREN) 75 MG EC tablet Take 75 mg by mouth. 10/20/23  Yes [provider]  omeprazole (PRILOSEC) 40 MG capsule Take 40 mg by mouth. 09/28/13  Yes [provider]  triamcinolone cream (KENALOG) 0.1 % SMARTSIG:Topical 1-2 Times Daily PRN 09/14/23  Yes [provider]  valACYclovir (VALTREX) 1000 MG tablet Take by mouth. 09/26/13  Yes [provider]  acetaminophen  (TYLENOL ) 500 MG tablet Take 1,000 mg by mouth every 6 (six) hours as needed for mild pain.    [provider]  Cholecalciferol (VITAMIN D -3) 125 MCG (5000 UT) TABS Take 20,000 tablets by mouth daily. With magnesium    [provider]  naproxen  (NAPROSYN ) 500 MG tablet Take 1 tablet (500 mg total) by mouth 2 (two) times daily  with a meal. 05/20/23   Early Glisson, MD  naproxen  sodium (ALEVE ) 220 MG tablet Take 220 mg by mouth 2 (two) times daily as needed (pain).    [provider]  polyethylene glycol-electrolytes (TRILYTE) 420 g solution Take 4,000 mLs by mouth as directed. 05/11/21   Vinetta Greening, DO  Semaglutide-Weight Management 2.4 MG/0.75ML SOAJ Inject 2.4 mg into the skin.    [provider]      Allergies    Patient has no known allergies.    Review of Systems   Review of Systems  Constitutional:  Positive for appetite change and fatigue.  Gastrointestinal:  Positive for nausea and vomiting.  Neurological:  Positive for headaches.  All other systems reviewed and are negative.   Physical Exam Updated Vital Signs BP (!) 150/94   Pulse 69   Temp 98.7 F (37.1 C) (Oral)   Resp 13   Ht 5\' 2"  (1.575 m)   Wt 68 kg   SpO2 93%   BMI 27.44 kg/m  Physical Exam Vitals and nursing note reviewed.  Constitutional:      General: She is not in acute distress.    Appearance: Normal appearance. She is well-developed. She is not ill-appearing, toxic-appearing or diaphoretic.  HENT:     Head: Normocephalic and atraumatic.     Right Ear: External ear normal.     Left Ear: External ear normal.     Nose: Nose normal.  Mouth/Throat:     Mouth: Mucous membranes are moist.  Eyes:     Extraocular Movements: Extraocular movements intact.     Conjunctiva/sclera: Conjunctivae normal.  Cardiovascular:     Rate and Rhythm: Normal rate and regular rhythm.     Heart sounds: No murmur heard. Pulmonary:     Effort: Pulmonary effort is normal. No respiratory distress.     Breath sounds: Normal breath sounds. No wheezing or rales.  Chest:     Chest wall: No tenderness.  Abdominal:     General: There is no distension.     Palpations: Abdomen is soft.     Tenderness: There is no abdominal tenderness.  Musculoskeletal:        General: No swelling. Normal range of motion.     Cervical  back: Normal range of motion and neck supple.  Skin:    General: Skin is warm and dry.     Coloration: Skin is not jaundiced or pale.  Neurological:     General: No focal deficit present.     Mental Status: She is alert and oriented to person, place, and time.  Psychiatric:        Mood and Affect: Mood normal.        Behavior: Behavior normal.     ED Results / Procedures / Treatments   Labs (all labs ordered are listed, but only abnormal results are displayed) Labs Reviewed  CBC WITH DIFFERENTIAL/PLATELET - Abnormal; Notable for the following components:      Result Value   RBC 3.17 (*)    Hemoglobin 9.8 (*)    HCT 29.0 (*)    All other components within normal limits  COMPREHENSIVE METABOLIC PANEL WITH GFR - Abnormal; Notable for the following components:   Potassium 2.9 (*)    BUN 34 (*)    Creatinine, Ser 2.52 (*)    Calcium 12.5 (*)    Total Protein 6.4 (*)    GFR, Estimated 21 (*)    All other components within normal limits  LIPASE, BLOOD  MAGNESIUM  CALCIUM, IONIZED  PARATHYROID  HORMONE, INTACT (NO CA)    EKG EKG Interpretation Date/Time:  Tuesday November 22 2023 07:52:51 EDT Ventricular Rate:  69 PR Interval:    QRS Duration:  110 QT Interval:  377 QTC Calculation: 404 R Axis:   -39  Text Interpretation: Sinus rhythm Left axis deviation Confirmed by Iva Mariner ([id number]) on 11/22/2023 8:56:40 AM  Radiology CT CHEST ABDOMEN PELVIS WO CONTRAST Result Date: 11/22/2023 CLINICAL DATA:  Unintended weight loss EXAM: CT CHEST, ABDOMEN AND PELVIS WITHOUT CONTRAST TECHNIQUE: Multidetector CT imaging of the chest, abdomen and pelvis was performed following the standard protocol without IV contrast. RADIATION DOSE REDUCTION: This exam was performed according to the departmental dose-optimization program which includes automated exposure control, adjustment of the mA and/or kV according to patient size and/or use of iterative reconstruction technique. COMPARISON:  01/19/2014  FINDINGS: CT CHEST FINDINGS Cardiovascular: Heart is normal size. Aorta is normal caliber. Mediastinum/Nodes: No mediastinal, hilar, or axillary adenopathy. Trachea and esophagus are unremarkable. Thyroid unremarkable. Scattered in small calcified mediastinal and bilateral hilar lymph nodes compatible with old granulomatous disease. Lungs/Pleura: Right upper lobe calcified granuloma. No confluent airspace opacities or effusions. Musculoskeletal: Chest wall soft tissues are unremarkable. No acute bony abnormality or focal lesion. CT ABDOMEN PELVIS FINDINGS Hepatobiliary: Diffuse low-density throughout the liver compatible with fatty infiltration. No focal abnormality. Gallbladder unremarkable. Pancreas: No focal abnormality or ductal dilatation. Spleen: Normal size. Calcifications throughout  compatible with old granulomatous disease. Adrenals/Urinary Tract: Adrenal glands normal. Punctate nonobstructing stone in the lower pole of the right kidney. No ureteral stones or hydronephrosis. Urinary bladder unremarkable. Stomach/Bowel: Normal appendix. Stomach, large and small bowel grossly unremarkable. Vascular/Lymphatic: No evidence of aneurysm or adenopathy. Reproductive: Uterus and adnexa unremarkable.  No mass. Other: No free fluid or free air. Musculoskeletal: No acute bony abnormality. Left L5 pars defect. Advanced degenerative facet disease in the lower lumbar spine. 13 mm anterolisthesis of L5 on S1. IMPRESSION: No acute findings in the chest, abdomen or pelvis. No explanation for the patient's weight loss. Old granulomatous disease. Hepatic steatosis. Nonobstructing right lower pole nephrolithiasis. Electronically Signed   By: Janeece Mechanic M.D.   On: 11/22/2023 10:29    Procedures Procedures    Medications Ordered in ED Medications  0.9 %  sodium chloride  infusion ( Intravenous New Bag/Given 11/22/23 0937)  sodium chloride  0.9 % bolus 1,000 mL (1,000 mLs Intravenous Bolus 11/22/23 0944)  potassium  chloride SA (KLOR-CON  M) CR tablet 40 mEq (40 mEq Oral Given 11/22/23 9811)    ED Course/ Medical Decision Making/ A&P                                 Medical Decision Making Amount and/or Complexity of Data Reviewed Labs: ordered. Radiology: ordered.  Risk Prescription drug management. Decision regarding hospitalization.   This patient presents to the ED for concern of abnormal outpatient labs, this involves an extensive number of treatment options, and is a complaint that carries with it a high risk of complications and morbidity.  The differential diagnosis includes dehydration, infection, neoplasm, medication side effect   Co morbidities that complicate the patient evaluation  nephrolithiasis, arthritis   Additional history obtained:  Additional history obtained from N/A External records from outside source obtained and reviewed including EMR   Lab Tests:  I Ordered, and personally interpreted labs.  The pertinent results include: Renal failure, hypercalcemia, hypokalemia, anemia.  These are all new from lab work 6 months ago.   Imaging Studies ordered:  I ordered imaging studies including CT of chest, abdomen, pelvis I independently visualized and interpreted imaging which showed no acute findings I agree with the radiologist interpretation   Cardiac Monitoring: / EKG:  The patient was maintained on a cardiac monitor.  I personally viewed and interpreted the cardiac monitored which showed an underlying rhythm of: Sinus rhythm   Problem List / ED Course / Critical interventions / Medication management  Patient presenting for abnormal outpatient lab results.  She underwent outpatient lab work yesterday.  Concerning lab findings from yesterday include elevated creatinine, hypokalemia, and hypercalcemia.  Most recent lab work for comparison was in late October.  At the time, she had normal kidney function and normal electrolytes.  Patient states that she does take  large doses of daily vitamin D .  This may be contributing to her hypercalcemia.  She has had poor appetite over the past 3 weeks with intermittent nausea and vomiting.  She also takes daily diclofenac.  These may be contributing to her acute renal failure.  On exam, she is well-appearing.  Abdomen is soft and nontender.  Breathing is unlabored.  Lab work today confirms AKI and hypercalcemia.  Other findings include hypokalemia and anemia, which was not present last year.  IV fluids were initiated.  CT imaging showed no acute findings.  Patient was admitted for further management. I ordered medication  including IV fluids for hydration Reevaluation of the patient after these medicines showed that the patient improved I have reviewed the patients home medicines and have made adjustments as needed   Social Determinants of Health:  Lives independently, has PCP        Final Clinical Impression(s) / ED Diagnoses Final diagnoses:  Acute renal failure, unspecified acute renal failure type (HCC)  Hypercalcemia    Rx / DC Orders ED Discharge Orders     None         Iva Mariner, MD 11/22/23 1047

## 2023-11-22 NOTE — Progress Notes (Signed)
   11/22/23 1945  TOC Brief Assessment  Insurance and Status Reviewed  Patient has primary care physician Yes  Home environment has been reviewed From home.  Prior level of function: Independent.  Prior/Current Home Services No current home services  Social Drivers of Health Review SDOH reviewed no interventions necessary  Readmission risk has been reviewed Yes  Transition of care needs no transition of care needs at this time   Transition of Care Department [facility]) has reviewed patient and no other TOC needs have been identified at this time. We will continue to monitor patient advancement through interdisciplinary progression rounds. If new patient needs arise, please place a TOC consult.

## 2023-11-22 NOTE — Plan of Care (Signed)
  Problem: Education: ?Goal: Knowledge of General Education information will improve ?Description: Including pain rating scale, medication(s)/side effects and non-pharmacologic comfort measures ?Outcome: Progressing ?  ?Problem: Health Behavior/Discharge Planning: ?Goal: Ability to manage health-related needs will improve ?Outcome: Progressing ?  ?Problem: Clinical Measurements: ?Goal: Will remain free from infection ?Outcome: Progressing ?Goal: Diagnostic test results will improve ?Outcome: Progressing ?Goal: Cardiovascular complication will be avoided ?Outcome: Progressing ?  ?Problem: Activity: ?Goal: Risk for activity intolerance will decrease ?Outcome: Progressing ?  ?

## 2023-11-22 NOTE — H&P (Signed)
 History and Physical    Judy Bryan HYQ:[id number] DOB: September 29, 1960 DOA: 11/22/2023  PCP: Sallye Crease, NP   Patient coming from: Home  Chief Complaint: Abnormal labs  HPI: Judy Bryan is a 63 y.o. female with medical history significant for nephrolithiasis, GERD, and osteoarthritis who apparently has been struggling with fatigue as well as poor appetite, nausea and vomiting over the last 1 month.  Her nausea and vomiting has been intermittent and therefore her oral intake has been quite poor.  Due to the symptoms, she schedule an appointment with her PCP yesterday and had lab work drawn.  She was informed of abnormal results and advised to come to the ED for further evaluation and workup.  She apparently takes high amounts of vitamin D , up to 40,000 units daily for well over a month thinking that this would help with her fatigue.  Her creatinine levels were supposedly in the normal range back in October when she last had her labs checked.   ED Course: Vital signs stable and patient afebrile with some mild blood pressure elevation noted.  Potassium 2.9 and hemoglobin 9.8 with creatinine 2.52.  Calcium 12.5.  Patient had been ordered potassium supplementation as well as initiation of IV fluid.  CT imaging with no acute findings noted.  Review of Systems: Reviewed as noted above, otherwise negative.  Past Medical History:  Diagnosis Date   Frequency of urination    History of kidney stones    Left ureteral calculus    Urgency of urination     Past Surgical History:  Procedure Laterality Date   COLONOSCOPY WITH PROPOFOL  N/A 06/08/2021   Procedure: COLONOSCOPY WITH PROPOFOL ;  Surgeon: Vinetta Greening, DO;  Location: [facility];  Service: Endoscopy;  Laterality: N/A;  8:30am   CYSTOSCOPY WITH RETROGRADE PYELOGRAM, URETEROSCOPY AND STENT PLACEMENT Left 05/10/2014   Procedure: CYSTOSCOPY WITH RETROGRADE PYELOGRAM, URETEROSCOPY AND DOUBLE J STENT PLACEMENT;  Surgeon: Roque Collar, MD;  Location: [facility];  Service: Urology;  Laterality: Left;   EXTRACORPOREAL SHOCK WAVE LITHOTRIPSY Left 01-24-2014   HOLMIUM LASER APPLICATION Left 05/10/2014   Procedure: HOLMIUM LASER APPLICATION;  Surgeon: Roque Collar, MD;  Location: [facility];  Service: Urology;  Laterality: Left;   POLYPECTOMY  06/08/2021   Procedure: POLYPECTOMY;  Surgeon: Vinetta Greening, DO;  Location: [facility];  Service: Endoscopy;;   STONE EXTRACTION WITH BASKET Left 05/10/2014   Procedure: STONE EXTRACTION WITH BASKET;  Surgeon: Roque Collar, MD;  Location: [facility];  Service: Urology;  Laterality: Left;   WISDOM TOOTH EXTRACTION  1977     reports that she has never smoked. She has never used smokeless tobacco. She reports that she does not drink alcohol and does not use drugs.  No Known Allergies  Family History  Problem Relation Age of Onset   Colon cancer Paternal Grandmother        in her 90s    Prior to Admission medications   Medication Sig Start Date End Date Taking? Authorizing Provider  diclofenac (VOLTAREN) 75 MG EC tablet Take 75 mg by mouth 2 (two) times daily. 10/20/23  Yes [provider]  diclofenac Sodium (VOLTAREN) 1 % GEL Apply 4 g topically 4 (four) times daily as needed (pain).   Yes [provider]  omeprazole (PRILOSEC) 40 MG capsule Take 40 mg by mouth. 09/28/13  Yes [provider]  triamcinolone cream (KENALOG) 0.1 % SMARTSIG:Topical 1-2 Times  Daily PRN 09/14/23  Yes [provider]  acetaminophen  (TYLENOL ) 500 MG tablet Take 1,000 mg by mouth every 6 (six) hours as needed for mild pain.   Yes [provider]  Cholecalciferol (VITAMIN D -3) 125 MCG (5000 UT) TABS Take 20,000 Units by mouth daily. With magnesium   Yes [provider]  naproxen  sodium (ALEVE ) 220 MG tablet Take 220 mg by mouth 2 (two) times daily as needed (pain).   Yes [provider]  Semaglutide-Weight Management 2.4 MG/0.75ML SOAJ Inject 2.4 mg into the skin.   Yes [provider]    Physical Exam: Vitals:   11/22/23 1030 11/22/23 1045 11/22/23 1100 11/22/23 1129  BP: (!) 153/95 (!) 148/85 (!) 147/87 (!) 146/99  Pulse: 71 70 70 77  Resp: 11 15 15 18   Temp:    98.9 F (37.2 C)  TempSrc:    Oral  SpO2: 95% 98% 97% 100%  Weight:    69.8 kg  Height:    5\' 2"  (1.575 m)    Constitutional: NAD, calm, comfortable Vitals:   11/22/23 1030 11/22/23 1045 11/22/23 1100 11/22/23 1129  BP: (!) 153/95 (!) 148/85 (!) 147/87 (!) 146/99  Pulse: 71 70 70 77  Resp: 11 15 15 18   Temp:    98.9 F (37.2 C)  TempSrc:    Oral  SpO2: 95% 98% 97% 100%  Weight:    69.8 kg  Height:    5\' 2"  (1.575 m)   Eyes: lids and conjunctivae normal Neck: normal, supple Respiratory: clear to auscultation bilaterally. Normal respiratory effort. No accessory muscle use.  Cardiovascular: Regular rate and rhythm, no murmurs. Abdomen: no tenderness, no distention. Bowel sounds positive.  Musculoskeletal:  No edema. Skin: no rashes, lesions, ulcers.  Psychiatric: Flat affect  Labs on Admission: I have personally reviewed following labs and imaging studies  CBC: Recent Labs  Lab 11/22/23 0716  WBC 5.1  NEUTROABS 2.5  HGB 9.8*  HCT 29.0*  MCV 91.5  PLT 246   Basic Metabolic Panel: Recent Labs  Lab 11/22/23 0716  NA 140  K 2.9*  CL 104  CO2 26  GLUCOSE 91  BUN 34*  CREATININE 2.52*  CALCIUM 12.5*  MG 1.8   GFR: Estimated Creatinine Clearance: 20.7 mL/min (A) (by C-G formula based on SCr of 2.52 mg/dL (H)). Liver Function Tests: Recent Labs  Lab 11/22/23 0716  AST 22  ALT 17  ALKPHOS 51  BILITOT 0.6  PROT 6.4*  ALBUMIN 3.6   Recent Labs  Lab 11/22/23 0716  LIPASE 36   No results for input(s): "AMMONIA" in the last 168 hours. Coagulation Profile: No results for input(s): "INR", "PROTIME" in the last 168 hours. Cardiac Enzymes: No  results for input(s): "CKTOTAL", "CKMB", "CKMBINDEX", "TROPONINI" in the last 168 hours. BNP (last 3 results) No results for input(s): "PROBNP" in the last 8760 hours. HbA1C: No results for input(s): "HGBA1C" in the last 72 hours. CBG: No results for input(s): "GLUCAP" in the last 168 hours. Lipid Profile: No results for input(s): "CHOL", "HDL", "LDLCALC", "TRIG", "CHOLHDL", "LDLDIRECT" in the last 72 hours. Thyroid Function Tests: No results for input(s): "TSH", "T4TOTAL", "FREET4", "T3FREE", "THYROIDAB" in the last 72 hours. Anemia Panel: No results for input(s): "VITAMINB12", "FOLATE", "FERRITIN", "TIBC", "IRON", "RETICCTPCT" in the last 72 hours. Urine analysis:    Component Value Date/Time   COLORURINE YELLOW 01/19/2014 1955   APPEARANCEUR HAZY (A) 01/19/2014 1955   LABSPEC >1.030 (H) 01/19/2014 1955   PHURINE 5.5 01/19/2014  1955   GLUCOSEU NEGATIVE 01/19/2014 1955   HGBUR SMALL (A) 01/19/2014 1955   BILIRUBINUR negative 02/19/2014 1936   KETONESUR negative 02/19/2014 1936   KETONESUR NEGATIVE 01/19/2014 1955   PROTEINUR negative 02/19/2014 1936   PROTEINUR TRACE (A) 01/19/2014 1955   UROBILINOGEN 0.2 02/19/2014 1936   UROBILINOGEN 0.2 01/19/2014 1955   NITRITE Negative 02/19/2014 1936   NITRITE NEGATIVE 01/19/2014 1955   LEUKOCYTESUR moderate (2+) 02/19/2014 1936    Radiological Exams on Admission: CT CHEST ABDOMEN PELVIS WO CONTRAST Result Date: 11/22/2023 CLINICAL DATA:  Unintended weight loss EXAM: CT CHEST, ABDOMEN AND PELVIS WITHOUT CONTRAST TECHNIQUE: Multidetector CT imaging of the chest, abdomen and pelvis was performed following the standard protocol without IV contrast. RADIATION DOSE REDUCTION: This exam was performed according to the departmental dose-optimization program which includes automated exposure control, adjustment of the mA and/or kV according to patient size and/or use of iterative reconstruction technique. COMPARISON:  01/19/2014 FINDINGS: CT CHEST  FINDINGS Cardiovascular: Heart is normal size. Aorta is normal caliber. Mediastinum/Nodes: No mediastinal, hilar, or axillary adenopathy. Trachea and esophagus are unremarkable. Thyroid unremarkable. Scattered in small calcified mediastinal and bilateral hilar lymph nodes compatible with old granulomatous disease. Lungs/Pleura: Right upper lobe calcified granuloma. No confluent airspace opacities or effusions. Musculoskeletal: Chest wall soft tissues are unremarkable. No acute bony abnormality or focal lesion. CT ABDOMEN PELVIS FINDINGS Hepatobiliary: Diffuse low-density throughout the liver compatible with fatty infiltration. No focal abnormality. Gallbladder unremarkable. Pancreas: No focal abnormality or ductal dilatation. Spleen: Normal size. Calcifications throughout compatible with old granulomatous disease. Adrenals/Urinary Tract: Adrenal glands normal. Punctate nonobstructing stone in the lower pole of the right kidney. No ureteral stones or hydronephrosis. Urinary bladder unremarkable. Stomach/Bowel: Normal appendix. Stomach, large and small bowel grossly unremarkable. Vascular/Lymphatic: No evidence of aneurysm or adenopathy. Reproductive: Uterus and adnexa unremarkable.  No mass. Other: No free fluid or free air. Musculoskeletal: No acute bony abnormality. Left L5 pars defect. Advanced degenerative facet disease in the lower lumbar spine. 13 mm anterolisthesis of L5 on S1. IMPRESSION: No acute findings in the chest, abdomen or pelvis. No explanation for the patient's weight loss. Old granulomatous disease. Hepatic steatosis. Nonobstructing right lower pole nephrolithiasis. Electronically Signed   By: Janeece Mechanic M.D.   On: 11/22/2023 10:29    EKG: Independently reviewed.  SR 69 bpm.  Assessment/Plan Principal Problem:   AKI (acute kidney injury) (HCC) Active Problems:   Hypercalcemia   Hypokalemia    AKI versus progression of CKD - Prior creatinine 0.8 noted 9 years ago on our chart, but  supposedly within normal limits late last year - Continue IV fluid - Check urine electrolytes - Nonobstructing renal stones noted - Continue to monitor strict I's and O's - Avoid nephrotoxic agents including NSAIDs  Hypokalemia - Replete and reevaluate  Hypercalcemia possibly responsible for abdominal pain/n/v -Noted to have history of taking large amounts of vitamin D  - Evaluate with further labs as ordered - Continue aggressive IV fluid and monitor  Hypertension - Continue to monitor for now and consider initiation of oral medications - IV hydralazine  for significant elevations  Osteoarthritis - Frequently uses NSAIDs at home, hold for now given AKI  Anemia - Follow CBC and check anemia panel  GERD - Continue PPI  Left-sided nephrolithiasis - Follows with urology outpatient   DVT prophylaxis: Heparin  Code Status: Full Family Communication: Boyfriend at bedside 4/22 Disposition Plan: Admit for IV hydration Consults called: None Admission status: Observation, telemetry  Severity of Illness: The appropriate patient  status for this patient is OBSERVATION. Observation status is judged to be reasonable and necessary in order to provide the required intensity of service to ensure the patient's safety. The patient's presenting symptoms, physical exam findings, and initial radiographic and laboratory data in the context of their medical condition is felt to place them at decreased risk for further clinical deterioration. Furthermore, it is anticipated that the patient will be medically stable for discharge from the hospital within 2 midnights of admission.    Ressie Slevin D Mason Sole DO [facility]  If 7PM-7AM, please contact night-coverage [email or web address]  11/22/2023, 11:48 AM

## 2023-11-22 NOTE — Plan of Care (Signed)

## 2023-11-23 DIAGNOSIS — E8809 Other disorders of plasma-protein metabolism, not elsewhere classified: Secondary | ICD-10-CM | POA: Diagnosis present

## 2023-11-23 DIAGNOSIS — N179 Acute kidney failure, unspecified: Secondary | ICD-10-CM | POA: Diagnosis present

## 2023-11-23 DIAGNOSIS — R509 Fever, unspecified: Secondary | ICD-10-CM | POA: Diagnosis not present

## 2023-11-23 DIAGNOSIS — K219 Gastro-esophageal reflux disease without esophagitis: Secondary | ICD-10-CM | POA: Diagnosis present

## 2023-11-23 DIAGNOSIS — K760 Fatty (change of) liver, not elsewhere classified: Secondary | ICD-10-CM | POA: Diagnosis present

## 2023-11-23 DIAGNOSIS — M1990 Unspecified osteoarthritis, unspecified site: Secondary | ICD-10-CM | POA: Diagnosis present

## 2023-11-23 DIAGNOSIS — E663 Overweight: Secondary | ICD-10-CM | POA: Diagnosis present

## 2023-11-23 DIAGNOSIS — Z800 Family history of malignant neoplasm of digestive organs: Secondary | ICD-10-CM | POA: Diagnosis not present

## 2023-11-23 DIAGNOSIS — D649 Anemia, unspecified: Secondary | ICD-10-CM | POA: Diagnosis present

## 2023-11-23 DIAGNOSIS — Z79899 Other long term (current) drug therapy: Secondary | ICD-10-CM | POA: Diagnosis not present

## 2023-11-23 DIAGNOSIS — Z96652 Presence of left artificial knee joint: Secondary | ICD-10-CM | POA: Diagnosis present

## 2023-11-23 DIAGNOSIS — Z6828 Body mass index (BMI) 28.0-28.9, adult: Secondary | ICD-10-CM | POA: Diagnosis not present

## 2023-11-23 DIAGNOSIS — T452X1A Poisoning by vitamins, accidental (unintentional), initial encounter: Secondary | ICD-10-CM | POA: Diagnosis present

## 2023-11-23 DIAGNOSIS — E876 Hypokalemia: Secondary | ICD-10-CM | POA: Diagnosis present

## 2023-11-23 DIAGNOSIS — E538 Deficiency of other specified B group vitamins: Secondary | ICD-10-CM | POA: Diagnosis present

## 2023-11-23 DIAGNOSIS — N200 Calculus of kidney: Secondary | ICD-10-CM | POA: Diagnosis present

## 2023-11-23 DIAGNOSIS — I10 Essential (primary) hypertension: Secondary | ICD-10-CM | POA: Diagnosis present

## 2023-11-23 LAB — BASIC METABOLIC PANEL WITH GFR
Anion gap: 6 (ref 5–15)
Anion gap: 8 (ref 5–15)
BUN: 25 mg/dL — ABNORMAL HIGH (ref 8–23)
BUN: 25 mg/dL — ABNORMAL HIGH (ref 8–23)
CO2: 26 mmol/L (ref 22–32)
CO2: 25 mmol/L (ref 22–32)
Calcium: 12.9 mg/dL — ABNORMAL HIGH (ref 8.9–10.3)
Calcium: 13 mg/dL — ABNORMAL HIGH (ref 8.9–10.3)
Chloride: 110 mmol/L (ref 98–111)
Chloride: 107 mmol/L (ref 98–111)
Creatinine, Ser: 2.21 mg/dL — ABNORMAL HIGH (ref 0.44–1.00)
Creatinine, Ser: 2.11 mg/dL — ABNORMAL HIGH (ref 0.44–1.00)
GFR, Estimated: 24 mL/min — ABNORMAL LOW (ref >60)
GFR, Estimated: 26 mL/min — ABNORMAL LOW (ref >60)
Glucose, Bld: 90 mg/dL (ref 70–99)
Glucose, Bld: 110 mg/dL — ABNORMAL HIGH (ref 70–99)
Potassium: 3.7 mmol/L (ref 3.5–5.1)
Potassium: 3.7 mmol/L (ref 3.5–5.1)
Sodium: 142 mmol/L (ref 135–145)
Sodium: 140 mmol/L (ref 135–145)

## 2023-11-23 LAB — CALCIUM, IONIZED: Calcium, Ionized, Serum: 6.9 mg/dL — ABNORMAL HIGH (ref 4.5–5.6)

## 2023-11-23 LAB — CBC
HCT: 29.7 % — ABNORMAL LOW (ref 36.0–46.0)
Hemoglobin: 9.9 g/dL — ABNORMAL LOW (ref 12.0–15.0)
MCH: 30.7 pg (ref 26.0–34.0)
MCHC: 33.3 g/dL (ref 30.0–36.0)
MCV: 92.2 fL (ref 80.0–100.0)
Platelets: 277 10*3/uL (ref 150–400)
RBC: 3.22 MIL/uL — ABNORMAL LOW (ref 3.87–5.11)
RDW: 11.8 % (ref 11.5–15.5)
WBC: 5.9 10*3/uL (ref 4.0–10.5)
nRBC: 0 % (ref 0.0–0.2)

## 2023-11-23 LAB — PARATHYROID HORMONE, INTACT (NO CA): PTH: 12 pg/mL — ABNORMAL LOW (ref 15–65)

## 2023-11-23 LAB — MAGNESIUM: Magnesium: 1.7 mg/dL (ref 1.7–2.4)

## 2023-11-23 MED ORDER — SODIUM CHLORIDE 0.9 % IV SOLN: INTRAVENOUS | Status: DC

## 2023-11-23 MED ORDER — ZOLEDRONIC ACID 4 MG/100ML IV SOLN
4.0000 mg | Freq: Once | INTRAVENOUS | Status: AC
  Administered 2023-11-23: 4 mg via INTRAVENOUS
  Filled 2023-11-23: qty 100

## 2023-11-23 NOTE — Plan of Care (Signed)
   Problem: Education: Goal: Knowledge of General Education information will improve Description: Including pain rating scale, medication(s)/side effects and non-pharmacologic comfort measures Outcome: Progressing   Problem: Clinical Measurements: Goal: Ability to maintain clinical measurements within normal limits will improve Outcome: Progressing Goal: Diagnostic test results will improve Outcome: Progressing

## 2023-11-23 NOTE — Progress Notes (Signed)
 [facility] PROGRESS NOTE  Judy Bryan (DOB: January 03, 1961) ZOX:[id number] PCP: Sallye Crease, NP  Brief Narrative: Judy Bryan is a 63 y.o. female with a history of nephrolithiasis, osteoarthritis, GERD who presented to the ED on 11/22/2023 with several weeks-months of feeling fatigued with nausea, poor oral intake, and mild confusion, mild depression, constitutional symptoms she finds difficult to describe. Years ago she felt improvement in similar symptoms when she took vitamin D  supplementation at a dose of 50k units weekly and has subsequently taking about 20k units twice daily for an extended time. In the ED she was found to have AKI (SCr 2.52) with hypercalcemia (Ca 12.5, albumin nl), vitamin D  level ultimately checked is undetectably elevated (>150). She was admitted for symptomatic hypercalcemia due to vitamin D  toxicity.   Subjective: Feels no better or worse since admission. Making more urine. Feels foggy headed at times. No constipation (goes QOD). No new complaints.   Objective: BP (!) 160/86   Pulse 82   Temp 98.5 F (36.9 C) (Oral)   Resp 18   Ht 5\' 2"  (1.575 m)   Wt 69.8 kg   SpO2 98%   BMI 28.13 kg/m   Gen: No distress, working on laptop.  Pulm: Clear, nonlabored  CV: RRR, no MRG GI: Soft, NT, ND, +BS  Neuro: Alert and oriented. No new focal deficits. Ext: Warm, no deformities Skin: No rashes, lesions or ulcers on visualized skin   Telemetry (personal review): NSR with rare PVC, can DC telemetry to facilitate mobility.   CT chest, abdomen, pelvis (personal review): Nonobstructing right nephrolithiasis, no acute findings. +hepatic steatosis.   Assessment & Plan: Principal Problem:   AKI (acute kidney injury) (HCC) Active Problems:   Hypercalcemia   Hypokalemia  AKI vs. progression to CKD: SCr baseline putatively normal, though elevated on admission and didn't improve with 1L IVF.  - Start NS at 150cc/hr and monitor PM BMP.   Hypercalcemia,  symptomatic due to vitamin D  unintentional overdose: Mild psychiatric, neurologic symptoms reported with dry mouth.  - IVF as above, consider zometa  if not improving as anticipated. Given duration of vitamin D  action, anticipate needing bisphosphonate therapy. If symptoms continue and/or Ca rises, would initiate calcitonin.  - Note hx urolithiasis requiring intervention not seen now, right lower pole radiopaque nephrolith noted.  - Note Renal impairment, anemia, hypercalcemia; though vitamin D  toxicity is primary issue here, will check SPEP/LCs.    Elevated BP:  - Suggest PCP follow up for this, will continue monitoring BP per routine.   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   Anemia: Stable. Normocytic with no bleeding reported.   Hypokalemia: Resolved with supplementation.  GERD:  - PPI  Wynetta Heckle, MD [facility] [email or web address] 11/23/2023, 12:34 PM

## 2023-11-24 ENCOUNTER — Inpatient Hospital Stay (HOSPITAL_COMMUNITY)

## 2023-11-24 DIAGNOSIS — N179 Acute kidney failure, unspecified: Secondary | ICD-10-CM | POA: Diagnosis not present

## 2023-11-24 LAB — KAPPA/LAMBDA LIGHT CHAINS
Kappa free light chain: 38.5 mg/L — ABNORMAL HIGH (ref 3.3–19.4)
Kappa, lambda light chain ratio: 1.75 — ABNORMAL HIGH (ref 0.26–1.65)
Lambda free light chains: 22 mg/L (ref 5.7–26.3)

## 2023-11-24 LAB — COMPREHENSIVE METABOLIC PANEL WITH GFR
ALT: 16 U/L (ref 0–44)
AST: 17 U/L (ref 15–41)
Albumin: 3.2 g/dL — ABNORMAL LOW (ref 3.5–5.0)
Alkaline Phosphatase: 45 U/L (ref 38–126)
Anion gap: 6 (ref 5–15)
BUN: 24 mg/dL — ABNORMAL HIGH (ref 8–23)
CO2: 24 mmol/L (ref 22–32)
Calcium: 11.3 mg/dL — ABNORMAL HIGH (ref 8.9–10.3)
Chloride: 111 mmol/L (ref 98–111)
Creatinine, Ser: 1.88 mg/dL — ABNORMAL HIGH (ref 0.44–1.00)
GFR, Estimated: 29 mL/min — ABNORMAL LOW (ref >60)
Glucose, Bld: 88 mg/dL (ref 70–99)
Potassium: 3.7 mmol/L (ref 3.5–5.1)
Sodium: 141 mmol/L (ref 135–145)
Total Bilirubin: 0.6 mg/dL (ref 0.0–1.2)
Total Protein: 5.9 g/dL — ABNORMAL LOW (ref 6.5–8.1)

## 2023-11-24 LAB — RETICULOCYTES
Immature Retic Fract: 6.2 % (ref 2.3–15.9)
RBC.: 3.01 MIL/uL — ABNORMAL LOW (ref 3.87–5.11)
Retic Count, Absolute: 36.1 10*3/uL (ref 19.0–186.0)
Retic Ct Pct: 1.2 % (ref 0.4–3.1)

## 2023-11-24 LAB — IRON AND TIBC
Iron: 123 ug/dL (ref 28–170)
Saturation Ratios: 41 % — ABNORMAL HIGH (ref 10.4–31.8)
TIBC: 302 ug/dL (ref 250–450)
UIBC: 179 ug/dL

## 2023-11-24 LAB — FOLATE: Folate: 6.6 ng/mL (ref >5.9)

## 2023-11-24 LAB — FERRITIN: Ferritin: 96 ng/mL (ref 11–307)

## 2023-11-24 LAB — TSH: TSH: 1.504 u[IU]/mL (ref 0.350–4.500)

## 2023-11-24 LAB — CALCITRIOL (1,25 DI-OH VIT D): Vit D, 1,25-Dihydroxy: 187 pg/mL — ABNORMAL HIGH (ref 24.8–81.5)

## 2023-11-24 LAB — VITAMIN B12: Vitamin B-12: 154 pg/mL — ABNORMAL LOW (ref 180–914)

## 2023-11-24 MED ORDER — VITAMIN B-12 1000 MCG PO TABS
1000.0000 ug | ORAL_TABLET | Freq: Every day | ORAL | Status: DC
  Administered 2023-11-25: 1000 ug via ORAL
  Filled 2023-11-24: qty 1

## 2023-11-24 MED ORDER — AMLODIPINE BESYLATE 5 MG PO TABS
5.0000 mg | ORAL_TABLET | Freq: Every day | ORAL | Status: DC
  Administered 2023-11-24 – 2023-11-25 (×2): 5 mg via ORAL
  Filled 2023-11-24 (×2): qty 1

## 2023-11-24 MED ORDER — SODIUM CHLORIDE 0.9 % IV SOLN: INTRAVENOUS | Status: AC

## 2023-11-24 MED ORDER — VITAMIN B-12 1000 MCG PO TABS: 1000.0000 ug | ORAL_TABLET | Freq: Every day | ORAL | Status: DC

## 2023-11-24 MED ORDER — CYANOCOBALAMIN 1000 MCG/ML IJ SOLN
1000.0000 ug | Freq: Every day | INTRAMUSCULAR | Status: DC
  Administered 2023-11-24: 1000 ug via INTRAMUSCULAR
  Filled 2023-11-24: qty 1

## 2023-11-24 MED ORDER — FOLIC ACID 1 MG PO TABS
1.0000 mg | ORAL_TABLET | Freq: Every day | ORAL | Status: DC
  Administered 2023-11-25: 1 mg via ORAL
  Filled 2023-11-24: qty 1

## 2023-11-24 NOTE — Progress Notes (Signed)
 [facility] PROGRESS NOTE  Judy Bryan (DOB: Jul 30, 1961) ZOX:[id number] PCP: Sallye Crease, NP  Brief Narrative: Judy Bryan is a 63 y.o. female with a history of nephrolithiasis, osteoarthritis, GERD who presented to the ED on 11/22/2023 with several weeks-months of feeling fatigued with nausea, poor oral intake, and mild confusion, mild depression, constitutional symptoms she finds difficult to describe. Years ago she felt improvement in similar symptoms when she took vitamin D  supplementation at a dose of 50k units weekly and has subsequently taking about 20k units twice daily for an extended time. In the ED she was found to have AKI (SCr 2.52) with hypercalcemia (Ca 12.5, albumin nl), vitamin D  level ultimately checked is undetectably elevated (>150). She was admitted for symptomatic hypercalcemia due to vitamin D  toxicity.   Subjective: Having some headache on top and back of her head in midline, some improvement with tylenol , feels it's associated with elevated BP readings. Overall, however, feels better today than yesterday. S.O. at bedside again today.   Objective: BP (!) 165/82 (BP Location: Left Arm)   Pulse 80   Temp 98.8 F (37.1 C) (Oral)   Resp 17   Ht 5\' 2"  (1.575 m)   Wt 69.8 kg   SpO2 99%   BMI 28.13 kg/m   Gen: No distress Pulm: Clear, nonlabored  CV: RRR, no MRG or JVD, minimal trace LE edema.  GI: Soft, NT, ND, +BS Neuro: Alert and oriented. No new focal deficits. Ext: Warm, no deformities. Skin: No rashes, lesions or ulcers on visualized skin    CT chest, abdomen, pelvis (personal review): Nonobstructing right nephrolithiasis, no acute findings. +hepatic steatosis.   Assessment & Plan: Principal Problem:   AKI (acute kidney injury) (HCC) Active Problems:   Hypercalcemia   Hypokalemia  AKI vs. progression to CKD: SCr baseline putatively normal, though elevated on admission, trending downward with treatment. CrCl ~46ml/min today.  - Continue  NS at 150cc/hr, no evidence of volume overload. Avoid nephrotoxins. Benefit > risk of single dose zometa  4/23.   Hypercalcemia, symptomatic due to vitamin D  unintentional overdose: Mild psychiatric, neurologic symptoms reported with dry mouth.  - IVF as above, s/p zoledronic  acid 4/23 with improvement in Ca 4/24. Still elevated to 11.3, correcting to 11.9 with hypoalbuminemia. Symptoms improved.  - Note hx left urolithiasis requiring intervention not seen now, right lower pole radiopaque nephrolith noted.  - Note Renal impairment, anemia, hypercalcemia; though vitamin D  toxicity is primary issue here, will check SPEP/LCs.    Elevated BP, suspect essential HTN more than situational:  - Still needs PCP follow up for this, but opts to initiate Tx here. Will start norvasc  5mg .  - Given hypercalcemia, would avoid thiazides; given renal impairment, would avoid RAAS agents at this time as well.   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   Anemia: Stable. Normocytic with no bleeding reported.  - Recheck intermittently.   Vitamin B 12 deficiency:  - Supplement IM x3 doses if still admitted, then transition to po.   Hypokalemia: Resolved with supplementation.  GERD:  - PPI  Wynetta Heckle, MD [facility] [email or web address] 11/24/2023, 11:21 AM

## 2023-11-24 NOTE — Plan of Care (Signed)

## 2023-11-25 ENCOUNTER — Inpatient Hospital Stay (HOSPITAL_COMMUNITY)

## 2023-11-25 DIAGNOSIS — N179 Acute kidney failure, unspecified: Secondary | ICD-10-CM | POA: Diagnosis not present

## 2023-11-25 LAB — CBC
HCT: 26 % — ABNORMAL LOW (ref 36.0–46.0)
Hemoglobin: 9.1 g/dL — ABNORMAL LOW (ref 12.0–15.0)
MCH: 31.8 pg (ref 26.0–34.0)
MCHC: 35 g/dL (ref 30.0–36.0)
MCV: 90.9 fL (ref 80.0–100.0)
Platelets: 191 10*3/uL (ref 150–400)
RBC: 2.86 MIL/uL — ABNORMAL LOW (ref 3.87–5.11)
RDW: 11.8 % (ref 11.5–15.5)
WBC: 4.1 10*3/uL (ref 4.0–10.5)
nRBC: 0 % (ref 0.0–0.2)

## 2023-11-25 LAB — COMPREHENSIVE METABOLIC PANEL WITH GFR
ALT: 17 U/L (ref 0–44)
AST: 17 U/L (ref 15–41)
Albumin: 2.9 g/dL — ABNORMAL LOW (ref 3.5–5.0)
Alkaline Phosphatase: 41 U/L (ref 38–126)
Anion gap: 4 — ABNORMAL LOW (ref 5–15)
BUN: 17 mg/dL (ref 8–23)
CO2: 24 mmol/L (ref 22–32)
Calcium: 10.2 mg/dL (ref 8.9–10.3)
Chloride: 110 mmol/L (ref 98–111)
Creatinine, Ser: 1.82 mg/dL — ABNORMAL HIGH (ref 0.44–1.00)
GFR, Estimated: 31 mL/min — ABNORMAL LOW (ref >60)
Glucose, Bld: 96 mg/dL (ref 70–99)
Potassium: 3.2 mmol/L — ABNORMAL LOW (ref 3.5–5.1)
Sodium: 138 mmol/L (ref 135–145)
Total Bilirubin: 0.4 mg/dL (ref 0.0–1.2)
Total Protein: 5.5 g/dL — ABNORMAL LOW (ref 6.5–8.1)

## 2023-11-25 LAB — PROTEIN ELECTROPHORESIS, SERUM
A/G Ratio: 1.2 (ref 0.7–1.7)
Albumin ELP: 3.1 g/dL (ref 2.9–4.4)
Alpha-1-Globulin: 0.2 g/dL (ref 0.0–0.4)
Alpha-2-Globulin: 0.7 g/dL (ref 0.4–1.0)
Beta Globulin: 0.9 g/dL (ref 0.7–1.3)
Gamma Globulin: 0.6 g/dL (ref 0.4–1.8)
Globulin, Total: 2.6 g/dL (ref 2.2–3.9)
Total Protein ELP: 5.7 g/dL — ABNORMAL LOW (ref 6.0–8.5)

## 2023-11-25 MED ORDER — CYANOCOBALAMIN 1000 MCG PO TABS: 1000.0000 ug | ORAL_TABLET | Freq: Every day | ORAL | 0 refills | Status: AC

## 2023-11-25 MED ORDER — FOLIC ACID 1 MG PO TABS: 1.0000 mg | ORAL_TABLET | Freq: Every day | ORAL | 0 refills | Status: AC

## 2023-11-25 MED ORDER — POTASSIUM CHLORIDE CRYS ER 20 MEQ PO TBCR
40.0000 meq | EXTENDED_RELEASE_TABLET | Freq: Once | ORAL | Status: AC
  Administered 2023-11-25: 40 meq via ORAL
  Filled 2023-11-25: qty 2

## 2023-11-25 NOTE — Plan of Care (Signed)
   Problem: Education: Goal: Knowledge of General Education information will improve Description: Including pain rating scale, medication(s)/side effects and non-pharmacologic comfort measures Outcome: Progressing   Problem: Clinical Measurements: Goal: Diagnostic test results will improve Outcome: Progressing   Problem: Activity: Goal: Risk for activity intolerance will decrease Outcome: Progressing

## 2023-11-25 NOTE — Discharge Summary (Signed)
 Physician Discharge Summary   Patient: Judy Bryan MRN: [id number] DOB: Jul 06, 1961  Admit date:     11/22/2023  Discharge date: 11/25/23  Discharge Physician: Wynetta Heckle   PCP: Sallye Crease, NP   Recommendations at discharge:  Follow up with PCP for routine management on 5/6. Attention to blood pressure (had non-severe BP elevation while admitted), calcium level (10.2 corrected to 11.1 on day of discharge), renal function SCr 2.21 > 1.82 during admission), and immunoglobulins (pending at discharge)  Discharge Diagnoses: Principal Problem:   AKI (acute kidney injury) (HCC) Active Problems:   Hypercalcemia   Hypokalemia  Hospital Course: Judy Bryan is a 63 y.o. female with a history of nephrolithiasis, osteoarthritis, GERD who presented to the ED on 11/22/2023 with several weeks-months of feeling fatigued with nausea, poor oral intake, and mild confusion, mild depression, constitutional symptoms she finds difficult to describe. Years ago she felt improvement in similar symptoms when she took vitamin D  supplementation at a dose of 50k units weekly and has subsequently taking about 20k units twice daily for an extended time. In the ED she was found to have AKI (SCr 2.52) with hypercalcemia (Ca 12.5, albumin nl), vitamin D  level ultimately checked is undetectably elevated (>150). She was admitted for symptomatic hypercalcemia due to vitamin D  toxicity. IV fluids and zoledronic  acid were given with improvement in symptoms, hypercalcemia, and AKI.   Assessment and Plan: AKI vs. CKD: SCr baseline putatively normal, though elevated on admission, trending downward with treatment. CrCl ~38ml/min at discharge.  - Needs recheck at follow up.   Hypercalcemia due to unintentional vitamin D  overdose: Mild psychiatric, neurologic symptoms reported with dry mouth, improving.  - s/p zoledronic  acid 4/23 with improvement in Ca thereafter, down to 10.2g/dl this AM (corrects to 96.2  w/hypoalbuminemia). Needs recheck CMP at follow up - Note hx left urolithiasis requiring intervention not seen now, right lower pole radiopaque nephrolith noted.  - Due to renal impairment, anemia, hypercalcemia, diagnosis of multiple myeloma was considered. No M spike on SPEP, mild KLC elevation, d/w heme/onc 4/24. Skeletal survey showed no concerning lesions. Immunoglobulins were sent and pending at time of discharge.   Fever: Developed 4/24 without symptoms or signs of infection. No sick contacts, no URI, pneumonia, UTI symptoms, no wounds or meningismus. Norvasc  and IM vitamin B12 had been given earlier in the day though no cutaneous or other reaction noted. WBC has remained normal. LE venous U/S showed no DVTs. TSH wnl. ?atelectasis.  - Blood cultures drawn and remain NGTD at time of discharge. Discharging physician will monitor these and contact patient if needed. Return precautions discussed at length.    Elevated BP, suspect essential HTN vs. situational: Not on medications PTA.  - Needs PCP follow up for this, but through shared decision making, plan is not to start treatment at this time.  - Given hypercalcemia, would avoid thiazides; given renal impairment, would avoid RAAS agents at this time as well.    OA:  - Hold NSAIDs - Reportedly due to have left TKA June 2025.    Anemia: Stable. Normocytic with no bleeding reported.  - Recheck intermittently.    Vitamin B 12 deficiency:  - Supplemented IM, discharged on oral supplement.    Hypokalemia: Resolved with supplementation. - Consider supplement if recurrent at follow up   GERD:  - PPI  Overweight: BMI now 28 after losing ~100lbs on semaglutide.   Consultants: Hematology/oncology, Dr. Orvis Blare Procedures performed: None  Disposition: Home Diet recommendation:  Cardiac diet DISCHARGE MEDICATION: Allergies as of 11/25/2023   No Known Allergies      Medication List     STOP taking these medications    diclofenac 75  MG EC tablet Commonly known as: VOLTAREN   naproxen  sodium 220 MG tablet Commonly known as: ALEVE    Vitamin D -3 125 MCG (5000 UT) Tabs       TAKE these medications    acetaminophen  500 MG tablet Commonly known as: TYLENOL  Take 1,000 mg by mouth every 6 (six) hours as needed for mild pain.   cyanocobalamin  1000 MCG tablet Take 1 tablet (1,000 mcg total) by mouth daily. Start taking on: November 26, 2023   diclofenac Sodium 1 % Gel Commonly known as: VOLTAREN Apply 4 g topically 4 (four) times daily as needed (pain).   folic acid  1 MG tablet Commonly known as: FOLVITE  Take 1 tablet (1 mg total) by mouth daily. Start taking on: November 26, 2023   omeprazole 40 MG capsule Commonly known as: PRILOSEC Take 40 mg by mouth.   Semaglutide-Weight Management 2.4 MG/0.75ML Soaj Inject 2.4 mg into the skin.   triamcinolone cream 0.1 % Commonly known as: KENALOG SMARTSIG:Topical 1-2 Times Daily PRN        Follow-up Information     Sallye Crease, NP Follow up.   Specialty: Family Medicine Contact information: [phone] [place] [address] [phone]                Discharge Exam: [name] Weights   11/22/23 0744 11/22/23 1129  Weight: 68 kg 69.8 kg  BP 133/86 (BP Location: Left Arm)   Pulse 88   Temp 99.3 F (37.4 C) (Oral)   Resp 17   Ht 5\' 2"  (1.575 m)   Wt 69.8 kg   SpO2 98%   BMI 28.13 kg/m   Well-appearing "stir crazy" (her words), pleasant female in no distress, ambulatory Clear, nonlabored No nuchal rigidity RRR, no MRG, trace very minimal edema Soft, NT, ND, normoactive bowel sounds No deformities  Condition at discharge: stable  The results of significant diagnostics from this hospitalization (including imaging, microbiology, ancillary and laboratory) are listed below for reference.   Imaging Studies: DG Bone Survey Met Result Date: 11/24/2023 CLINICAL DATA:  Hypercalcemia. EXAM: METASTATIC BONE SURVEY COMPARISON:  CT chest  abdomen and pelvis 11/22/2023. FINDINGS: There are no suspicious focal lytic or blastic osseous lesions. There is no acute fracture or dislocation identified. Degenerative changes affect the spine, bilateral knees and hips. There are bilateral pars interarticularis defects at L4 with 11 mm of anterolisthesis at L4-L5. IMPRESSION: 1. No suspicious focal lytic or blastic osseous lesions. 2. 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 Electronically Signed   By: Tyron Gallon M.D.   On: 11/24/2023 22:19   CT CHEST ABDOMEN PELVIS WO CONTRAST Result Date: 11/22/2023 CLINICAL DATA:  Unintended weight loss EXAM: CT CHEST, ABDOMEN AND PELVIS WITHOUT CONTRAST TECHNIQUE: Multidetector CT imaging of the chest, abdomen and pelvis was performed following the standard protocol without IV contrast. RADIATION DOSE REDUCTION: This exam was performed according to the departmental dose-optimization program which includes automated exposure control, adjustment of the mA and/or kV according to patient size and/or use of iterative reconstruction technique. COMPARISON:  01/19/2014 FINDINGS: CT CHEST FINDINGS Cardiovascular: Heart is normal size. Aorta is normal caliber. Mediastinum/Nodes: No mediastinal, hilar, or axillary adenopathy. Trachea and esophagus are unremarkable. Thyroid unremarkable. Scattered in small calcified mediastinal and bilateral  hilar lymph nodes compatible with old granulomatous disease. Lungs/Pleura: Right upper lobe calcified granuloma. No confluent airspace opacities or effusions. Musculoskeletal: Chest wall soft tissues are unremarkable. No acute bony abnormality or focal lesion. CT ABDOMEN PELVIS FINDINGS Hepatobiliary: Diffuse low-density throughout the liver compatible with fatty infiltration. No focal abnormality. Gallbladder unremarkable. Pancreas: No focal abnormality or ductal dilatation. Spleen: Normal size. Calcifications throughout compatible with old  granulomatous disease. Adrenals/Urinary Tract: Adrenal glands normal. Punctate nonobstructing stone in the lower pole of the right kidney. No ureteral stones or hydronephrosis. Urinary bladder unremarkable. Stomach/Bowel: Normal appendix. Stomach, large and small bowel grossly unremarkable. Vascular/Lymphatic: No evidence of aneurysm or adenopathy. Reproductive: Uterus and adnexa unremarkable.  No mass. Other: No free fluid or free air. Musculoskeletal: No acute bony abnormality. Left L5 pars defect. Advanced degenerative facet disease in the lower lumbar spine. 13 mm anterolisthesis of L5 on S1. IMPRESSION: No acute findings in the chest, abdomen or pelvis. No explanation for the patient's weight loss. Old granulomatous disease. Hepatic steatosis. Nonobstructing right lower pole nephrolithiasis. Electronically Signed   By: Janeece Mechanic M.D.   On: 11/22/2023 10:29    Microbiology: Results for orders placed or performed during the hospital encounter of 11/22/23  Culture, blood (Routine X 2) w Reflex to ID Panel     Status: None (Preliminary result)   Collection Time: 11/24/23  7:58 PM   Specimen: BLOOD  Result Value Ref Range Status   Specimen Description BLOOD LEFT ANTECUBITAL  Final   Special Requests   Final    BOTTLES DRAWN AEROBIC AND ANAEROBIC Blood Culture adequate volume   Culture   Final    NO GROWTH < 24 HOURS Performed at [facility], [address], [place], [place] [address]    Report Status PENDING  Incomplete  Culture, blood (Routine X 2) w Reflex to ID Panel     Status: None (Preliminary result)   Collection Time: 11/24/23  7:59 PM   Specimen: BLOOD  Result Value Ref Range Status   Specimen Description BLOOD BLOOD RIGHT HAND  Final   Special Requests   Final    BOTTLES DRAWN AEROBIC AND ANAEROBIC Blood Culture adequate volume   Culture   Final    NO GROWTH < 24 HOURS Performed at [facility], [address], [place], [place] [address]    Report Status PENDING  Incomplete     Labs: CBC: Recent Labs  Lab 11/22/23 0716 11/23/23 0500 11/25/23 0452  WBC 5.1 5.9 4.1  NEUTROABS 2.5  --   --   HGB 9.8* 9.9* 9.1*  HCT 29.0* 29.7* 26.0*  MCV 91.5 92.2 90.9  PLT 246 277 191   Basic Metabolic Panel: Recent Labs  Lab 11/22/23 0716 11/23/23 0500 11/23/23 1610 11/24/23 0506 11/25/23 0452  NA 140 142 140 141 138  K 2.9* 3.7 3.7 3.7 3.2*  CL 104 110 107 111 110  CO2 26 26 25 24 24   GLUCOSE 91 90 110* 88 96  BUN 34* 25* 25* 24* 17  CREATININE 2.52* 2.21* 2.11* 1.88* 1.82*  CALCIUM 12.5* 12.9* 13.0* 11.3* 10.2  MG 1.8 1.7  --   --   --    Liver Function Tests: Recent Labs  Lab 11/22/23 0716 11/24/23 0506 11/25/23 0452  AST 22 17 17   ALT 17 16 17   ALKPHOS 51 45 41  BILITOT 0.6 0.6 0.4  PROT 6.4* 5.9* 5.5*  ALBUMIN 3.6 3.2* 2.9*   CBG: No results for input(s): "GLUCAP" in the last  168 hours.  Discharge time spent: greater than 30 minutes.  Signed: Wynetta Heckle, MD [facility] 11/25/2023

## 2023-11-26 LAB — IGG, IGA, IGM
IgA: 196 mg/dL (ref 87–352)
IgG (Immunoglobin G), Serum: 651 mg/dL (ref 586–1602)
IgM (Immunoglobulin M), Srm: 88 mg/dL (ref 26–217)

## 2023-11-29 LAB — CULTURE, BLOOD (ROUTINE X 2)
Culture: NO GROWTH
Culture: NO GROWTH
Special Requests: ADEQUATE
Special Requests: ADEQUATE

## 2023-12-01 LAB — PTH-RELATED PEPTIDE: PTH-related peptide: <2 pmol/L
